# Patient Record
Sex: Female | Born: 1965
Health system: Southern US, Community
[De-identification: ages and names within clinical notes are randomized; demographics above are authoritative.]

## PROBLEM LIST (undated history)

## (undated) DIAGNOSIS — K219 Gastro-esophageal reflux disease without esophagitis: Secondary | ICD-10-CM

## (undated) DIAGNOSIS — T7840XA Allergy, unspecified, initial encounter: Secondary | ICD-10-CM

## (undated) HISTORY — DX: Allergy, unspecified, initial encounter: T78.40XA

## (undated) HISTORY — PX: APPENDECTOMY: SHX54

## (undated) HISTORY — DX: Gastro-esophageal reflux disease without esophagitis: K21.9

## (undated) HISTORY — PX: DENTAL RESTORATION/EXTRACTION WITH X-RAY: SHX5796

## (undated) HISTORY — PX: TUBAL LIGATION: SHX77

## (undated) HISTORY — PX: CARPAL TUNNEL RELEASE: SHX101

---

## 1997-12-30 ENCOUNTER — Inpatient Hospital Stay (HOSPITAL_COMMUNITY): Admission: AD | Admit: 1997-12-30 | Discharge: 1997-12-30 | Payer: Self-pay | Admitting: Obstetrics & Gynecology

## 1998-01-05 ENCOUNTER — Encounter (HOSPITAL_COMMUNITY): Admission: RE | Admit: 1998-01-05 | Discharge: 1998-02-13 | Payer: Self-pay | Admitting: Obstetrics & Gynecology

## 1998-01-16 ENCOUNTER — Inpatient Hospital Stay (HOSPITAL_COMMUNITY): Admission: AD | Admit: 1998-01-16 | Discharge: 1998-01-16 | Payer: Self-pay | Admitting: Obstetrics & Gynecology

## 1998-01-17 ENCOUNTER — Ambulatory Visit (HOSPITAL_COMMUNITY): Admission: RE | Admit: 1998-01-17 | Discharge: 1998-01-17 | Payer: Self-pay | Admitting: *Deleted

## 1998-02-08 ENCOUNTER — Encounter: Payer: Self-pay | Admitting: Obstetrics & Gynecology

## 1998-02-08 ENCOUNTER — Inpatient Hospital Stay (HOSPITAL_COMMUNITY): Admission: AD | Admit: 1998-02-08 | Discharge: 1998-02-11 | Payer: Self-pay | Admitting: *Deleted

## 1998-05-05 ENCOUNTER — Encounter: Payer: Self-pay | Admitting: Family Medicine

## 1998-05-05 ENCOUNTER — Ambulatory Visit (HOSPITAL_COMMUNITY): Admission: RE | Admit: 1998-05-05 | Discharge: 1998-05-05 | Payer: Self-pay | Admitting: Family Medicine

## 1998-10-06 ENCOUNTER — Emergency Department (HOSPITAL_COMMUNITY): Admission: EM | Admit: 1998-10-06 | Discharge: 1998-10-06 | Payer: Self-pay | Admitting: Emergency Medicine

## 1999-03-19 HISTORY — PX: TUBAL LIGATION: SHX77

## 1999-12-06 ENCOUNTER — Other Ambulatory Visit: Admission: RE | Admit: 1999-12-06 | Discharge: 1999-12-06 | Payer: Self-pay | Admitting: Obstetrics and Gynecology

## 2000-02-10 ENCOUNTER — Inpatient Hospital Stay (HOSPITAL_COMMUNITY): Admission: AD | Admit: 2000-02-10 | Discharge: 2000-02-10 | Payer: Self-pay | Admitting: Obstetrics and Gynecology

## 2000-02-11 ENCOUNTER — Encounter (INDEPENDENT_AMBULATORY_CARE_PROVIDER_SITE_OTHER): Payer: Self-pay

## 2000-02-11 ENCOUNTER — Inpatient Hospital Stay (HOSPITAL_COMMUNITY): Admission: AD | Admit: 2000-02-11 | Discharge: 2000-02-13 | Payer: Self-pay | Admitting: Obstetrics & Gynecology

## 2001-03-30 ENCOUNTER — Encounter: Admission: RE | Admit: 2001-03-30 | Discharge: 2001-05-12 | Payer: Self-pay | Admitting: Family Medicine

## 2001-05-12 ENCOUNTER — Encounter: Admission: RE | Admit: 2001-05-12 | Discharge: 2001-05-12 | Payer: Self-pay

## 2003-12-03 ENCOUNTER — Emergency Department (HOSPITAL_COMMUNITY): Admission: EM | Admit: 2003-12-03 | Discharge: 2003-12-03 | Payer: Self-pay | Admitting: Emergency Medicine

## 2004-05-29 ENCOUNTER — Other Ambulatory Visit: Admission: RE | Admit: 2004-05-29 | Discharge: 2004-05-29 | Payer: Self-pay | Admitting: Obstetrics and Gynecology

## 2004-11-14 ENCOUNTER — Emergency Department (HOSPITAL_COMMUNITY): Admission: EM | Admit: 2004-11-14 | Discharge: 2004-11-14 | Payer: Self-pay | Admitting: Emergency Medicine

## 2005-01-30 ENCOUNTER — Emergency Department (HOSPITAL_COMMUNITY): Admission: EM | Admit: 2005-01-30 | Discharge: 2005-01-30 | Payer: Self-pay | Admitting: Emergency Medicine

## 2005-07-17 ENCOUNTER — Encounter: Payer: Self-pay | Admitting: Obstetrics & Gynecology

## 2006-12-01 ENCOUNTER — Encounter: Admission: RE | Admit: 2006-12-01 | Discharge: 2006-12-01 | Payer: Self-pay | Admitting: Occupational Medicine

## 2009-12-19 ENCOUNTER — Observation Stay (HOSPITAL_COMMUNITY): Admission: EM | Admit: 2009-12-19 | Discharge: 2009-12-20 | Payer: Self-pay | Admitting: Emergency Medicine

## 2009-12-19 ENCOUNTER — Encounter (INDEPENDENT_AMBULATORY_CARE_PROVIDER_SITE_OTHER): Payer: Self-pay | Admitting: Internal Medicine

## 2010-04-08 ENCOUNTER — Encounter: Payer: Self-pay | Admitting: Internal Medicine

## 2010-05-31 LAB — CK TOTAL AND CKMB (NOT AT ARMC)
CK, MB: 1.4 ng/mL (ref 0.3–4.0)
CK, MB: 1.8 ng/mL (ref 0.3–4.0)
CK, MB: 2 ng/mL (ref 0.3–4.0)
Relative Index: INVALID (ref 0.0–2.5)
Relative Index: INVALID (ref 0.0–2.5)
Relative Index: INVALID (ref 0.0–2.5)
Total CK: 58 U/L (ref 7–177)
Total CK: 71 U/L (ref 7–177)
Total CK: 72 U/L (ref 7–177)

## 2010-05-31 LAB — HEMOGLOBIN A1C
Hgb A1c MFr Bld: 6.1 % — ABNORMAL HIGH (ref <5.7)
Mean Plasma Glucose: 128 mg/dL — ABNORMAL HIGH (ref <117)

## 2010-05-31 LAB — DIFFERENTIAL
Basophils Absolute: 0 10*3/uL (ref 0.0–0.1)
Basophils Relative: 0 % (ref 0–1)
Eosinophils Absolute: 0 10*3/uL (ref 0.0–0.7)
Eosinophils Relative: 1 % (ref 0–5)
Lymphocytes Relative: 19 % (ref 12–46)
Lymphs Abs: 1.1 10*3/uL (ref 0.7–4.0)
Monocytes Absolute: 0.2 10*3/uL (ref 0.1–1.0)
Monocytes Relative: 4 % (ref 3–12)
Neutro Abs: 4.4 10*3/uL (ref 1.7–7.7)
Neutrophils Relative %: 76 % (ref 43–77)

## 2010-05-31 LAB — URINALYSIS, ROUTINE W REFLEX MICROSCOPIC
Bilirubin Urine: NEGATIVE
Glucose, UA: NEGATIVE mg/dL
Hgb urine dipstick: NEGATIVE
Ketones, ur: NEGATIVE mg/dL
Nitrite: NEGATIVE
Protein, ur: NEGATIVE mg/dL
Specific Gravity, Urine: 1.025 (ref 1.005–1.030)
Urobilinogen, UA: 1 mg/dL (ref 0.0–1.0)
pH: 6.5 (ref 5.0–8.0)

## 2010-05-31 LAB — URINE MICROSCOPIC-ADD ON

## 2010-05-31 LAB — RAPID URINE DRUG SCREEN, HOSP PERFORMED
Amphetamines: NOT DETECTED
Barbiturates: NOT DETECTED
Benzodiazepines: NOT DETECTED
Cocaine: NOT DETECTED
Opiates: NOT DETECTED
Tetrahydrocannabinol: NOT DETECTED

## 2010-05-31 LAB — CBC
HCT: 42 % (ref 36.0–46.0)
Hemoglobin: 14.3 g/dL (ref 12.0–15.0)
MCH: 29.1 pg (ref 26.0–34.0)
MCHC: 33.9 g/dL (ref 30.0–36.0)
MCV: 85.8 fL (ref 78.0–100.0)
Platelets: 272 10*3/uL (ref 150–400)
RBC: 4.9 MIL/uL (ref 3.87–5.11)
RDW: 13.6 % (ref 11.5–15.5)
WBC: 5.8 10*3/uL (ref 4.0–10.5)

## 2010-05-31 LAB — TSH: TSH: 0.764 u[IU]/mL (ref 0.350–4.500)

## 2010-05-31 LAB — LIPID PANEL
Cholesterol: 144 mg/dL (ref 0–200)
HDL: 46 mg/dL (ref >39)
LDL Cholesterol: 82 mg/dL (ref 0–99)
Total CHOL/HDL Ratio: 3.1 RATIO
Triglycerides: 82 mg/dL (ref <150)
VLDL: 16 mg/dL (ref 0–40)

## 2010-05-31 LAB — COMPREHENSIVE METABOLIC PANEL
ALT: 17 U/L (ref 0–35)
AST: 23 U/L (ref 0–37)
Albumin: 3.8 g/dL (ref 3.5–5.2)
Alkaline Phosphatase: 101 U/L (ref 39–117)
BUN: 8 mg/dL (ref 6–23)
CO2: 32 mEq/L (ref 19–32)
Calcium: 9.5 mg/dL (ref 8.4–10.5)
Chloride: 102 mEq/L (ref 96–112)
Creatinine, Ser: 0.6 mg/dL (ref 0.4–1.2)
GFR calc Af Amer: >60 mL/min (ref >60)
GFR calc non Af Amer: >60 mL/min (ref >60)
Glucose, Bld: 126 mg/dL — ABNORMAL HIGH (ref 70–99)
Potassium: 4 mEq/L (ref 3.5–5.1)
Sodium: 140 mEq/L (ref 135–145)
Total Bilirubin: 1.2 mg/dL (ref 0.3–1.2)
Total Protein: 8 g/dL (ref 6.0–8.3)

## 2010-05-31 LAB — TROPONIN I
Troponin I: 0.01 ng/mL (ref 0.00–0.06)
Troponin I: 0.02 ng/mL (ref 0.00–0.06)
Troponin I: <0.01 ng/mL (ref 0.00–0.06)

## 2010-05-31 LAB — GLUCOSE, CAPILLARY: Glucose-Capillary: 121 mg/dL — ABNORMAL HIGH (ref 70–99)

## 2010-05-31 LAB — LIPASE, BLOOD: Lipase: 35 U/L (ref 11–59)

## 2010-05-31 LAB — POCT PREGNANCY, URINE: Preg Test, Ur: NEGATIVE

## 2010-08-03 NOTE — Op Note (Signed)
[facility]  Patient:    Anna Brooks, Anna Brooks                            MRN: [id number] Proc. Date: 02/12/00 Adm. Date:  [id number] Disc. Date: [id number] Attending:  Pleas Koch                           Operative Report  PREOPERATIVE DIAGNOSIS:       Postpartum elective sterilization.  POSTOPERATIVE DIAGNOSIS:      Postpartum elective sterilization.  OPERATION:                    Postpartum tubal ligation by Pomeroy method.  SURGEON:                      Georgina Peer, M.D.  ANESTHESIA:                   General, Dr. Arby Barrette.  ESTIMATED BLOOD LOSS:         Less than 25 cc.  COMPLICATIONS:                None.  INDICATIONS:                  This is a 45 year old gravida 6, para 5 who presented at term.  Had a spontaneous vaginal delivery by the nurse midwife service.  Desired no further children.  She consented to sterilization procedure for postpartum tubal.  Aware of risks and complications of procedure including bleeding, infection, 1-2% lifetime failure risk, and anesthetic complications.  She was willing to proceed.  DESCRIPTION OF PROCEDURE:     Patient was taken to the operating room, given general anesthetic, placed in a dorsal supine position.  Umbilical area was sterilely prepped and draped.  Marcaine 0.25% 7 cc was injected subumbilically and then an incision was made approximately 5 cm under the umbilicus.  The fascia was identified and divided.  Bleeders were cauterized.  Peritoneum identified and divided.  There were no adhesions.  The right tube was identified, traced to its fimbriated end.  Tube and ovary appeared normal.  A mid portion knuckle elevated.  Two plain gut sutures were placed across the knuckle of tube and the mid portion excised.  Bleeders were cauterized.  The left cornu was identified.  Tube traced to its fimbriated end.  Tube and ovary appeared normal.  Knuckle of tube was elevated.  Two sutures of plain ties  placed across this knuckle of tube and the mid portion excised.  The bleeders were cauterized.  There was excellent hemostasis.  The fascia was closed with Vicryl suture.  The skin closed with subcuticular Dexon.  Sponge, needle, and instrument counts were correct.  The patient tolerated the procedure well and returned to recovery area in stable condition. DD:  02/12/00 TD:  02/12/00 Job: [id number]

## 2010-08-03 NOTE — Discharge Summary (Signed)
[facility]  Patient:    Anna Brooks, Anna Brooks                            MRN: [id number] Adm. Date:  [id number] Disc. Date: [id number] Attending:  Pleas Brooks Dictator:   Anna Brooks, C.N.M.                           Discharge Summary  ADMISSION DIAGNOSES:          1. Intrauterine pregnancy at term.                               2. Grand multiparity.                               3. Negative group B strep.                               4. Active labor.                               5. Desires bilateral tubal ligation for                                  sterilization.  DISCHARGE DIAGNOSES:          1. Intrauterine pregnancy at term.                               2. Grand multiparity.                               3. Negative group B strep.                               4. Active labor.                               5. Desires bilateral tubal ligation for                                  sterilization.                               6. Bottle feeding.  PROCEDURE:                    1. Normal spontaneous vaginal delivery of a                                  viable female infant who had Apgars of 8 and                                  9 and  weighed 5 pounds 15 ounces on February 11, 2000 attended by Anna Brooks, C.N.M.                               2. Postpartum bilateral tubal ligation for                                  sterilization on February 12, 2000 by Dr.                                  Trudie Brooks.  HOSPITAL COURSE:              [name] is a 45 year old married African female gravida 6, para 5-0-0-5 at 27 1/7 weeks admitted in active labor who progressed to a normal spontaneous vaginal delivery of a viable female infant who had Apgars of 8 and 9 and weighed 5 pounds 15 ounces with no complications.  Her delivery was attended by Anna Brooks, C.N.M. and please see delivery note for details.  Her  postpartum course she had a bilateral tubal ligation on day #1 postpartum with no complications by Dr. Trudie Brooks.  She is ambulating, voiding, and eating without difficulty.  Her vital signs are stable and she is afebrile.  She is deemed ready for discharge today.  DISCHARGE INSTRUCTIONS:       As per the [facility] handout.  DISCHARGE MEDICATIONS:        1. Motrin 600 mg p.o. q.6h. p.r.n. for pain.                               2. Tylox one to two p.o. q.4-6h. p.r.n. for                                  pain.                               3. Prenatal vitamins daily.  DISCHARGE LABORATORIES:       Hemoglobin 10.2, platelets 207, WBC 10.7.  DISCHARGE FOLLOW-UP:          Six weeks at [facility] or p.r.n. DD:  02/13/00 TD:  02/13/00 Job: [id number]

## 2010-08-03 NOTE — H&P (Signed)
[facility]  Patient:    Anna Brooks, Anna Brooks                            MRN: [id number] Adm. Date:  [id number] Attending:  Cleatrice Burke Dictator:   Miguel Dibble, C.N.M.                         History and Physical  DATE OF BIRTH:                  11/06/1965  HISTORY OF PRESENT ILLNESS:     This is a 45 year old gravida 6, para 5 at 40-1/[redacted] weeks gestation who has been in early labor since 2:30 a.m.  Her cervix changed from 4 cm on her last exam in the office to 6 cm on presentation, 80% effaced, vertex at -2 and posterior.  She has a history of of negative group beta strep.  PRENATAL LABORATORY DATA:       Her prenatal labs on entry into the practice are as follows.  Hemoglobin 10.4, hematocrit 30.9, platelet count 256,000, blood type and Rh AB positive.  Rh antibody negative.  Sickle cell trait negative.  VDRL nonreactive.  Rubella titer immune.  Hepatitis B surface antigen negative.  Pap smear within normal limits.  Gonorrhea and Chlamydia cultures negative.  Glucose challenge test within normal limits.  Group beta strep at 36 weeks is negative.  Ultrasound reveals normal anatomy and agrees with dates at 31-6/7 weeks.  OBSTETRICAL HISTORY:            October 1991 normal spontaneous vaginal delivery of a viable female weighing 4 kg at 38 weeks after 8-9 hours of labor in [place].  In May 1994, normal spontaneous vaginal delivery of a viable baby girl weighing 3.5 kg at 39 weeks after 8 hours in [place].  October 1999, normal spontaneous vaginal delivery of a viable female weighing 4.1 kg at 39 weeks after 6 hours of labor in the [place].  June 1997, normal spontaneous vaginal delivery of a viable baby girl weighing 4.2 kg at 39 weeks after 7 hours labor in the [place].  November 1999 normal spontaneous vaginal delivery of a viable baby girl weighing 6 pounds at 40 weeks after 48 hours of labor born in [place] at [facility].  This is her sixth  pregnancy.  PRENATAL HISTORY:               This patient presented late to care at approximately 30 weeks and 4 days, attempted to receive care at [facility], however, had difficulty scheduling early appointment.  This pregnancy was complicated by an apparent dermatitis to her fingers that was treated with antibiotics by her family doctor.  She desired a postpartum tubal ligation.  ALLERGIES:                      SEPTRA.  PAST MEDICAL HISTORY:           History of varicose veins in the right leg, no current phlebitis.  Appendicitis and appendectomy at age 4.  FAMILY HISTORY:                 Negative.  GENETIC HISTORY:                The patient is 45 years old and previously declined amniocentesis or other genetic testing.  SOCIAL HISTORY:  The patient is [place] religion.  Married to [employer].  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  Father of the baby is a high Garment/textile technologist and also works at [employer].  Stable monogamous relationship. Denies smoking, alcohol or drug abuse.  PHYSICAL EXAMINATION:  HEENT:                          Within normal limits.  LUNGS:                          Bilaterally clear.  HEART:                          Regular rate and rhythm.  ABDOMEN:                        Soft and nontender.  Contractions every 7 minutes.  Fetal heart rate is reactive and reassuring.  EXTREMITIES:                    Right leg with nonphlebitic varicosities.  PELVIC:                         Cervix 6 cm, 80% effaced, vertex at -2, posterior.  ASSESSMENT:                     1. Grand multiparous at term in early active                                    phase labor.                                 2. Posterior position.                                 3. Negative group beta strep.                                  4. Desires postpartum tubal ligation.  PLAN:                           1. Admit to labor and delivery.                                 2. Notify Dr. Kathryne Sharper of admission                                    status.                                 3. Routine [facility] orders.  4. Anticipate normal spontaneous vaginal                                    delivery. DD:  02/11/00 TD:  02/11/00 Job: [id number]

## 2011-02-21 ENCOUNTER — Ambulatory Visit (INDEPENDENT_AMBULATORY_CARE_PROVIDER_SITE_OTHER): Payer: BC Managed Care – PPO

## 2011-02-21 DIAGNOSIS — J069 Acute upper respiratory infection, unspecified: Secondary | ICD-10-CM

## 2011-02-21 DIAGNOSIS — R300 Dysuria: Secondary | ICD-10-CM

## 2011-02-21 DIAGNOSIS — R509 Fever, unspecified: Secondary | ICD-10-CM

## 2011-03-08 ENCOUNTER — Ambulatory Visit (INDEPENDENT_AMBULATORY_CARE_PROVIDER_SITE_OTHER): Payer: BC Managed Care – PPO

## 2011-03-08 DIAGNOSIS — R05 Cough: Secondary | ICD-10-CM

## 2011-03-08 DIAGNOSIS — R059 Cough, unspecified: Secondary | ICD-10-CM

## 2011-03-08 DIAGNOSIS — R062 Wheezing: Secondary | ICD-10-CM

## 2011-03-11 ENCOUNTER — Ambulatory Visit (INDEPENDENT_AMBULATORY_CARE_PROVIDER_SITE_OTHER): Payer: BC Managed Care – PPO

## 2011-03-11 DIAGNOSIS — J159 Unspecified bacterial pneumonia: Secondary | ICD-10-CM

## 2011-03-11 DIAGNOSIS — R5381 Other malaise: Secondary | ICD-10-CM

## 2012-06-30 ENCOUNTER — Ambulatory Visit: Payer: BC Managed Care – PPO | Admitting: Family Medicine

## 2012-06-30 ENCOUNTER — Telehealth: Payer: Self-pay | Admitting: Family Medicine

## 2012-06-30 VITALS — BP 132/79 | HR 75 | Temp 98.0°F | Resp 16 | Ht 65.5 in | Wt 157.0 lb

## 2012-06-30 DIAGNOSIS — J019 Acute sinusitis, unspecified: Secondary | ICD-10-CM

## 2012-06-30 DIAGNOSIS — R109 Unspecified abdominal pain: Secondary | ICD-10-CM

## 2012-06-30 DIAGNOSIS — N390 Urinary tract infection, site not specified: Secondary | ICD-10-CM

## 2012-06-30 DIAGNOSIS — M545 Low back pain, unspecified: Secondary | ICD-10-CM

## 2012-06-30 LAB — POCT UA - MICROSCOPIC ONLY
Casts, Ur, LPF, POC: NEGATIVE
Crystals, Ur, HPF, POC: NEGATIVE
Yeast, UA: NEGATIVE

## 2012-06-30 LAB — POCT URINALYSIS DIPSTICK
Bilirubin, UA: NEGATIVE
Glucose, UA: NEGATIVE
Nitrite, UA: NEGATIVE
Protein, UA: 30
Spec Grav, UA: >=1.03
Urobilinogen, UA: 1
pH, UA: 5.5

## 2012-06-30 MED ORDER — CETIRIZINE HCL 10 MG PO TABS: 10.0000 mg | ORAL_TABLET | Freq: Every day | ORAL | Status: DC

## 2012-06-30 MED ORDER — FLUTICASONE PROPIONATE 50 MCG/ACT NA SUSP: 2.0000 | Freq: Every day | NASAL | Status: DC

## 2012-06-30 MED ORDER — METHYLPREDNISOLONE 4 MG PO KIT: PACK | ORAL | Status: DC

## 2012-06-30 MED ORDER — SULFAMETHOXAZOLE-TRIMETHOPRIM 800-160 MG PO TABS: 1.0000 | ORAL_TABLET | Freq: Two times a day (BID) | ORAL | Status: DC

## 2012-06-30 MED ORDER — MUCINEX DM MAXIMUM STRENGTH 60-1200 MG PO TB12: 1.0000 | ORAL_TABLET | Freq: Two times a day (BID) | ORAL | Status: DC

## 2012-06-30 NOTE — Patient Instructions (Addendum)
Sinusitis Sinusitis is redness, soreness, and swelling (inflammation) of the paranasal sinuses. Paranasal sinuses are air pockets within the bones of your face (beneath the eyes, the middle of the forehead, or above the eyes). In healthy paranasal sinuses, mucus is able to drain out, and air is able to circulate through them by way of your nose. However, when your paranasal sinuses are inflamed, mucus and air can become trapped. This can allow bacteria and other germs to grow and cause infection. Sinusitis can develop quickly and last only a short time (acute) or continue over a long period (chronic). Sinusitis that lasts for more than 12 weeks is considered chronic.  CAUSES  Causes of sinusitis include:  Allergies.  Structural abnormalities, such as displacement of the cartilage that separates your nostrils (deviated septum), which can decrease the air flow through your nose and sinuses and affect sinus drainage.  Functional abnormalities, such as when the small hairs (cilia) that line your sinuses and help remove mucus do not work properly or are not present. SYMPTOMS  Symptoms of acute and chronic sinusitis are the same. The primary symptoms are pain and pressure around the affected sinuses. Other symptoms include:  Upper toothache.  Earache.  Headache.  Bad breath.  Decreased sense of smell and taste.  A cough, which worsens when you are lying flat.  Fatigue.  Fever.  Thick drainage from your nose, which often is green and may contain pus (purulent).  Swelling and warmth over the affected sinuses. DIAGNOSIS  Your caregiver will perform a physical exam. During the exam, your caregiver may:  Look in your nose for signs of abnormal growths in your nostrils (nasal polyps).  Tap over the affected sinus to check for signs of infection.  View the inside of your sinuses (endoscopy) with a special imaging device with a light attached (endoscope), which is inserted into your  sinuses. If your caregiver suspects that you have chronic sinusitis, one or more of the following tests may be recommended:  Allergy tests.  Nasal culture A sample of mucus is taken from your nose and sent to a lab and screened for bacteria.  Nasal cytology A sample of mucus is taken from your nose and examined by your caregiver to determine if your sinusitis is related to an allergy. TREATMENT  Most cases of acute sinusitis are related to a viral infection and will resolve on their own within 10 days. Sometimes medicines are prescribed to help relieve symptoms (pain medicine, decongestants, nasal steroid sprays, or saline sprays).  However, for sinusitis related to a bacterial infection, your caregiver will prescribe antibiotic medicines. These are medicines that will help kill the bacteria causing the infection.  Rarely, sinusitis is caused by a fungal infection. In theses cases, your caregiver will prescribe antifungal medicine. For some cases of chronic sinusitis, surgery is needed. Generally, these are cases in which sinusitis recurs more than 3 times per year, despite other treatments. HOME CARE INSTRUCTIONS   Drink plenty of water. Water helps thin the mucus so your sinuses can drain more easily.  Use a humidifier.  Inhale steam 3 to 4 times a day (for example, sit in the bathroom with the shower running).  Apply a warm, moist washcloth to your face 3 to 4 times a day, or as directed by your caregiver.  Use saline nasal sprays to help moisten and clean your sinuses.  Take over-the-counter or prescription medicines for pain, discomfort, or fever only as directed by your caregiver. SEEK IMMEDIATE MEDICAL   CARE IF:  You have increasing pain or severe headaches.  You have nausea, vomiting, or drowsiness.  You have swelling around your face.  You have vision problems.  You have a stiff neck.  You have difficulty breathing. MAKE SURE YOU:   Understand these  instructions.  Will watch your condition.  Will get help right away if you are not doing well or get worse. Document Released: 03/04/2005 Document Revised: 05/27/2011 Document Reviewed: 03/19/2011 [facility] Patient Information 2013 [place], [place].   Low Back Strain with Rehab A strain is an injury in which a tendon or muscle is torn. The muscles and tendons of the lower back are vulnerable to strains. However, these muscles and tendons are very strong and require a great force to be injured. Strains are classified into three categories. Grade 1 strains cause pain, but the tendon is not lengthened. Grade 2 strains include a lengthened ligament, due to the ligament being stretched or partially ruptured. With grade 2 strains there is still function, although the function may be decreased. Grade 3 strains involve a complete tear of the tendon or muscle, and function is usually impaired. SYMPTOMS   Pain in the lower back.  Pain that affects one side more than the other.  Pain that gets worse with movement and may be felt in the hip, buttocks, or back of the thigh.  Muscle spasms of the muscles in the back.  Swelling along the muscles of the back.  Loss of strength of the back muscles.  Crackling sound (crepitation) when the muscles are touched. CAUSES  Lower back strains occur when a force is placed on the muscles or tendons that is greater than they can handle. Common causes of injury include:  Prolonged overuse of the muscle-tendon units in the lower back, usually from incorrect posture.  A single violent injury or force applied to the back. RISK INCREASES WITH:  Sports that involve twisting forces on the spine or a lot of bending at the waist (football, rugby, weightlifting, bowling, golf, tennis, speed skating, racquetball, swimming, running, gymnastics, diving).  Poor strength and flexibility.  Failure to warm up properly before activity.  Family history of lower back pain or  disk disorders.  Previous back injury or surgery (especially fusion).  Poor posture with lifting, especially heavy objects.  Prolonged sitting, especially with poor posture. PREVENTION   Learn and use proper posture when sitting or lifting (maintain proper posture when sitting, lift using the knees and legs, not at the waist).  Warm up and stretch properly before activity.  Allow for adequate recovery between workouts.  Maintain physical fitness:  Strength, flexibility, and endurance.  Cardiovascular fitness. PROGNOSIS  If treated properly, lower back strains usually heal within 6 weeks. RELATED COMPLICATIONS   Recurring symptoms, resulting in a chronic problem.  Chronic inflammation, scarring, and partial muscle-tendon tear.  Delayed healing or resolution of symptoms.  Prolonged disability. TREATMENT  Treatment first involves the use of ice and medicine, to reduce pain and inflammation. The use of strengthening and stretching exercises may help reduce pain with activity. These exercises may be performed at home or with a therapist. Severe injuries may require referral to a therapist for further evaluation and treatment, such as ultrasound. Your caregiver may advise that you wear a back brace or corset, to help reduce pain and discomfort. Often, prolonged bed rest results in greater harm then benefit. Corticosteroid injections may be recommended. However, these should be reserved for the most serious cases. It is important  to avoid using your back when lifting objects. At night, sleep on your back on a firm mattress with a pillow placed under your knees. If non-surgical treatment is unsuccessful, surgery may be needed.  MEDICATION   If pain medicine is needed, nonsteroidal anti-inflammatory medicines (aspirin and ibuprofen), or other minor pain relievers (acetaminophen), are often advised.  Do not take pain medicine for 7 days before surgery.  Prescription pain relievers may be  given, if your caregiver thinks they are needed. Use only as directed and only as much as you need.  Ointments applied to the skin may be helpful.  Corticosteroid injections may be given by your caregiver. These injections should be reserved for the most serious cases, because they may only be given a certain number of times. HEAT AND COLD  Cold treatment (icing) should be applied for 10 to 15 minutes every 2 to 3 hours for inflammation and pain, and immediately after activity that aggravates your symptoms. Use ice packs or an ice massage.  Heat treatment may be used before performing stretching and strengthening activities prescribed by your caregiver, physical therapist, or athletic trainer. Use a heat pack or a warm water soak. SEEK MEDICAL CARE IF:   Symptoms get worse or do not improve in 2 to 4 weeks, despite treatment.  You develop numbness, weakness, or loss of bowel or bladder function.  New, unexplained symptoms develop. (Drugs used in treatment may produce side effects.) EXERCISES  RANGE OF MOTION (ROM) AND STRETCHING EXERCISES - Low Back Strain Most people with lower back pain will find that their symptoms get worse with excessive bending forward (flexion) or arching at the lower back (extension). The exercises which will help resolve your symptoms will focus on the opposite motion.  Your physician, physical therapist or athletic trainer will help you determine which exercises will be most helpful to resolve your lower back pain. Do not complete any exercises without first consulting with your caregiver. Discontinue any exercises which make your symptoms worse until you speak to your caregiver.  If you have pain, numbness or tingling which travels down into your buttocks, leg or foot, the goal of the therapy is for these symptoms to move closer to your back and eventually resolve. Sometimes, these leg symptoms will get better, but your lower back pain may worsen. This is typically an  indication of progress in your rehabilitation. Be very alert to any changes in your symptoms and the activities in which you participated in the 24 hours prior to the change. Sharing this information with your caregiver will allow him/her to most efficiently treat your condition.  These exercises may help you when beginning to rehabilitate your injury. Your symptoms may resolve with or without further involvement from your physician, physical therapist or athletic trainer. While completing these exercises, remember:  Restoring tissue flexibility helps normal motion to return to the joints. This allows healthier, less painful movement and activity.  An effective stretch should be held for at least 30 seconds.  A stretch should never be painful. You should only feel a gentle lengthening or release in the stretched tissue. FLEXION RANGE OF MOTION AND STRETCHING EXERCISES: STRETCH  Flexion, Single Knee to Chest   Lie on a firm bed or floor with both legs extended in front of you.  Keeping one leg in contact with the floor, bring your opposite knee to your chest. Hold your leg in place by either grabbing behind your thigh or at your knee.  Pull until you  feel a gentle stretch in your lower back. Hold __________ seconds.  Slowly release your grasp and repeat the exercise with the opposite side. Repeat __________ times. Complete this exercise __________ times per day.  STRETCH  Flexion, Double Knee to Chest   Lie on a firm bed or floor with both legs extended in front of you.  Keeping one leg in contact with the floor, bring your opposite knee to your chest.  Tense your stomach muscles to support your back and then lift your other knee to your chest. Hold your legs in place by either grabbing behind your thighs or at your knees.  Pull both knees toward your chest until you feel a gentle stretch in your lower back. Hold __________ seconds.  Tense your stomach muscles and slowly return one leg  at a time to the floor. Repeat __________ times. Complete this exercise __________ times per day.  STRETCH  Low Trunk Rotation  Lie on a firm bed or floor. Keeping your legs in front of you, bend your knees so they are both pointed toward the ceiling and your feet are flat on the floor.  Extend your arms out to the side. This will stabilize your upper body by keeping your shoulders in contact with the floor.  Gently and slowly drop both knees together to one side until you feel a gentle stretch in your lower back. Hold for __________ seconds.  Tense your stomach muscles to support your lower back as you bring your knees back to the starting position. Repeat the exercise to the other side. Repeat __________ times. Complete this exercise __________ times per day  EXTENSION RANGE OF MOTION AND FLEXIBILITY EXERCISES: STRETCH  Extension, Prone on Elbows   Lie on your stomach on the floor, a bed will be too soft. Place your palms about shoulder width apart and at the height of your head.  Place your elbows under your shoulders. If this is too painful, stack pillows under your chest.  Allow your body to relax so that your hips drop lower and make contact more completely with the floor.  Hold this position for __________ seconds.  Slowly return to lying flat on the floor. Repeat __________ times. Complete this exercise __________ times per day.  RANGE OF MOTION  Extension, Prone Press Ups  Lie on your stomach on the floor, a bed will be too soft. Place your palms about shoulder width apart and at the height of your head.  Keeping your back as relaxed as possible, slowly straighten your elbows while keeping your hips on the floor. You may adjust the placement of your hands to maximize your comfort. As you gain motion, your hands will come more underneath your shoulders.  Hold this position __________ seconds.  Slowly return to lying flat on the floor. Repeat __________ times. Complete this  exercise __________ times per day.  RANGE OF MOTION- Quadruped, Neutral Spine   Assume a hands and knees position on a firm surface. Keep your hands under your shoulders and your knees under your hips. You may place padding under your knees for comfort.  Drop your head and point your tail bone toward the ground below you. This will round out your lower back like an angry cat. Hold this position for __________ seconds.  Slowly lift your head and release your tail bone so that your back sags into a large arch, like an old horse.  Hold this position for __________ seconds.  Repeat this until you feel limber  in your lower back.  Now, find your "sweet spot." This will be the most comfortable position somewhere between the two previous positions. This is your neutral spine. Once you have found this position, tense your stomach muscles to support your lower back.  Hold this position for __________ seconds. Repeat __________ times. Complete this exercise __________ times per day.  STRENGTHENING EXERCISES - Low Back Strain These exercises may help you when beginning to rehabilitate your injury. These exercises should be done near your "sweet spot." This is the neutral, low-back arch, somewhere between fully rounded and fully arched, that is your least painful position. When performed in this safe range of motion, these exercises can be used for people who have either a flexion or extension based injury. These exercises may resolve your symptoms with or without further involvement from your physician, physical therapist or athletic trainer. While completing these exercises, remember:   Muscles can gain both the endurance and the strength needed for everyday activities through controlled exercises.  Complete these exercises as instructed by your physician, physical therapist or athletic trainer. Increase the resistance and repetitions only as guided.  You may experience muscle soreness or fatigue, but  the pain or discomfort you are trying to eliminate should never worsen during these exercises. If this pain does worsen, stop and make certain you are following the directions exactly. If the pain is still present after adjustments, discontinue the exercise until you can discuss the trouble with your caregiver. STRENGTHENING Deep Abdominals, Pelvic Tilt  Lie on a firm bed or floor. Keeping your legs in front of you, bend your knees so they are both pointed toward the ceiling and your feet are flat on the floor.  Tense your lower abdominal muscles to press your lower back into the floor. This motion will rotate your pelvis so that your tail bone is scooping upwards rather than pointing at your feet or into the floor.  With a gentle tension and even breathing, hold this position for __________ seconds. Repeat __________ times. Complete this exercise __________ times per day.  STRENGTHENING  Abdominals, Crunches   Lie on a firm bed or floor. Keeping your legs in front of you, bend your knees so they are both pointed toward the ceiling and your feet are flat on the floor. Cross your arms over your chest.  Slightly tip your chin down without bending your neck.  Tense your abdominals and slowly lift your trunk high enough to just clear your shoulder blades. Lifting higher can put excessive stress on the lower back and does not further strengthen your abdominal muscles.  Control your return to the starting position. Repeat __________ times. Complete this exercise __________ times per day.  STRENGTHENING  Quadruped, Opposite UE/LE Lift   Assume a hands and knees position on a firm surface. Keep your hands under your shoulders and your knees under your hips. You may place padding under your knees for comfort.  Find your neutral spine and gently tense your abdominal muscles so that you can maintain this position. Your shoulders and hips should form a rectangle that is parallel with the floor and is not  twisted.  Keeping your trunk steady, lift your right hand no higher than your shoulder and then your left leg no higher than your hip. Make sure you are not holding your breath. Hold this position __________ seconds.  Continuing to keep your abdominal muscles tense and your back steady, slowly return to your starting position. Repeat with the opposite  arm and leg. Repeat __________ times. Complete this exercise __________ times per day.  STRENGTHENING  Lower Abdominals, Double Knee Lift  Lie on a firm bed or floor. Keeping your legs in front of you, bend your knees so they are both pointed toward the ceiling and your feet are flat on the floor.  Tense your abdominal muscles to brace your lower back and slowly lift both of your knees until they come over your hips. Be certain not to hold your breath.  Hold __________ seconds. Using your abdominal muscles, return to the starting position in a slow and controlled manner. Repeat __________ times. Complete this exercise __________ times per day.  POSTURE AND BODY MECHANICS CONSIDERATIONS - Low Back Strain Keeping correct posture when sitting, standing or completing your activities will reduce the stress put on different body tissues, allowing injured tissues a chance to heal and limiting painful experiences. The following are general guidelines for improved posture. Your physician or physical therapist will provide you with any instructions specific to your needs. While reading these guidelines, remember:  The exercises prescribed by your provider will help you have the flexibility and strength to maintain correct postures.  The correct posture provides the best environment for your joints to work. All of your joints have less wear and tear when properly supported by a spine with good posture. This means you will experience a healthier, less painful body.  Correct posture must be practiced with all of your activities, especially prolonged sitting and  standing. Correct posture is as important when doing repetitive low-stress activities (typing) as it is when doing a single heavy-load activity (lifting). RESTING POSITIONS Consider which positions are most painful for you when choosing a resting position. If you have pain with flexion-based activities (sitting, bending, stooping, squatting), choose a position that allows you to rest in a less flexed posture. You would want to avoid curling into a fetal position on your side. If your pain worsens with extension-based activities (prolonged standing, working overhead), avoid resting in an extended position such as sleeping on your stomach. Most people will find more comfort when they rest with their spine in a more neutral position, neither too rounded nor too arched. Lying on a non-sagging bed on your side with a pillow between your knees, or on your back with a pillow under your knees will often provide some relief. Keep in mind, being in any one position for a prolonged period of time, no matter how correct your posture, can still lead to stiffness. PROPER SITTING POSTURE In order to minimize stress and discomfort on your spine, you must sit with correct posture. Sitting with good posture should be effortless for a healthy body. Returning to good posture is a gradual process. Many people can work toward this most comfortably by using various supports until they have the flexibility and strength to maintain this posture on their own. When sitting with proper posture, your ears will fall over your shoulders and your shoulders will fall over your hips. You should use the back of the chair to support your upper back. Your lower back will be in a neutral position, just slightly arched. You may place a small pillow or folded towel at the base of your lower back for support.  When working at a desk, create an environment that supports good, upright posture. Without extra support, muscles tire, which leads to  excessive strain on joints and other tissues. Keep these recommendations in mind: CHAIR:  A chair should be  able to slide under your desk when your back makes contact with the back of the chair. This allows you to work closely.  The chair's height should allow your eyes to be level with the upper part of your monitor and your hands to be slightly lower than your elbows. BODY POSITION  Your feet should make contact with the floor. If this is not possible, use a foot rest.  Keep your ears over your shoulders. This will reduce stress on your neck and lower back. INCORRECT SITTING POSTURES  If you are feeling tired and unable to assume a healthy sitting posture, do not slouch or slump. This puts excessive strain on your back tissues, causing more damage and pain. Healthier options include:  Using more support, like a lumbar pillow.  Switching tasks to something that requires you to be upright or walking.  Talking a brief walk.  Lying down to rest in a neutral-spine position. PROLONGED STANDING WHILE SLIGHTLY LEANING FORWARD  When completing a task that requires you to lean forward while standing in one place for a long time, place either foot up on a stationary 2-4 inch high object to help maintain the best posture. When both feet are on the ground, the lower back tends to lose its slight inward curve. If this curve flattens (or becomes too large), then the back and your other joints will experience too much stress, tire more quickly, and can cause pain. CORRECT STANDING POSTURES Proper standing posture should be assumed with all daily activities, even if they only take a few moments, like when brushing your teeth. As in sitting, your ears should fall over your shoulders and your shoulders should fall over your hips. You should keep a slight tension in your abdominal muscles to brace your spine. Your tailbone should point down to the ground, not behind your body, resulting in an over-extended  swayback posture.  INCORRECT STANDING POSTURES  Common incorrect standing postures include a forward head, locked knees and/or an excessive swayback. WALKING Walk with an upright posture. Your ears, shoulders and hips should all line-up. PROLONGED ACTIVITY IN A FLEXED POSITION When completing a task that requires you to bend forward at your waist or lean over a low surface, try to find a way to stabilize 3 out of 4 of your limbs. You can place a hand or elbow on your thigh or rest a knee on the surface you are reaching across. This will provide you more stability so that your muscles do not fatigue as quickly. By keeping your knees relaxed, or slightly bent, you will also reduce stress across your lower back. CORRECT LIFTING TECHNIQUES DO :   Assume a wide stance. This will provide you more stability and the opportunity to get as close as possible to the object which you are lifting.  Tense your abdominals to brace your spine. Bend at the knees and hips. Keeping your back locked in a neutral-spine position, lift using your leg muscles. Lift with your legs, keeping your back straight.  Test the weight of unknown objects before attempting to lift them.  Try to keep your elbows locked down at your sides in order get the best strength from your shoulders when carrying an object.  Always ask for help when lifting heavy or awkward objects. INCORRECT LIFTING TECHNIQUES DO NOT:   Lock your knees when lifting, even if it is a small object.  Bend and twist. Pivot at your feet or move your feet when needing to change  directions.  Assume that you can safely pick up even a paper clip without proper posture. Document Released: 03/04/2005 Document Revised: 05/27/2011 Document Reviewed: 06/16/2008 [facility] Patient Information 2013 [place], [place].

## 2012-06-30 NOTE — Progress Notes (Signed)
Subjective:    Patient ID: Anna Brooks, female    DOB: 07/21/1965, 47 y.o.   MRN: [id number] Chief Complaint  Patient presents with  . Sinusitis    x 5 day  . Rash    today  . Cough    x 5 day  . Abdominal Pain    x 3 day  . Back Pain    x 3 day    HPI  For the past 5d she has had headache treated with mucinex, dayquil, and ibuprofen w/o relief.  It is getting worse - she woke up with PND and feels weak.  She is sneezing a lot, diffuse sinus pressure, clear mucous, did have a very low grade fever and chills last night to 99.9.  Is having some left teeth upper due to cavity - has dental appt next wk.  Has submandibular LAD and eyes itchy but no sore throat or ear pain.  Is having itchy eyes and itchy nose.  No allergy medicines but was using zyrtec prev.  Also she feels like she is developing a rash on her arms this morning.  Also having bilateral upper abd pain and lower back pain worse with bending over.  Has been taking ibuprofen 400mg  bid approx though none today.  No changes in urine, Normal bowels. Normal appetite, no n/v until this morning when she lost her taste.  Occ heartburn/indigestion for which she takes prn tums.  History reviewed. No pertinent past medical history. No current outpatient prescriptions on file prior to visit.   No current facility-administered medications on file prior to visit.   Allergies  Allergen Reactions  . Sulfa Antibiotics      Review of Systems  Constitutional: Positive for fever, chills and fatigue. Negative for diaphoresis, activity change and appetite change.  HENT: Positive for congestion, rhinorrhea, sneezing, dental problem, postnasal drip and sinus pressure. Negative for ear pain, nosebleeds, sore throat, trouble swallowing, neck pain, neck stiffness, voice change and ear discharge.   Eyes: Positive for itching. Negative for discharge.  Respiratory: Positive for cough. Negative for shortness of breath.   Cardiovascular: Negative for  chest pain.  Gastrointestinal: Positive for abdominal pain. Negative for nausea, vomiting, diarrhea and constipation.  Genitourinary: Negative for dysuria, frequency, decreased urine volume and difficulty urinating.  Musculoskeletal: Positive for back pain.  Skin: Positive for rash.  Neurological: Positive for weakness and headaches. Negative for dizziness and syncope.  Hematological: Positive for adenopathy.  Psychiatric/Behavioral: Positive for sleep disturbance.      BP 132/79  Pulse 75  Temp(Src) 98 F (36.7 C) (Oral)  Resp 16  Ht 5' 5.5" (1.664 m)  Wt 157 lb (71.215 kg)  BMI 25.72 kg/m2  SpO2 97% Objective:   Physical Exam  Constitutional: She is oriented to person, place, and time. She appears well-developed and well-nourished. She does not appear ill. No distress.  HENT:  Head: Normocephalic and atraumatic.  Right Ear: External ear and ear canal normal. Tympanic membrane is retracted. A middle ear effusion is present.  Left Ear: External ear and ear canal normal. Tympanic membrane is retracted. A middle ear effusion is present.  Nose: Mucosal edema and rhinorrhea present. Right sinus exhibits maxillary sinus tenderness. Left sinus exhibits maxillary sinus tenderness.  Mouth/Throat: Uvula is midline and mucous membranes are normal. No oropharyngeal exudate, posterior oropharyngeal edema, posterior oropharyngeal erythema or tonsillar abscesses.  Eyes: Conjunctivae are normal. Right eye exhibits no discharge. Left eye exhibits no discharge. No scleral icterus.  Neck: Normal range  of motion. Neck supple.  Cardiovascular: Normal rate, regular rhythm, normal heart sounds and intact distal pulses.   Pulmonary/Chest: Effort normal and breath sounds normal.  Musculoskeletal:       Lumbar back: She exhibits bony tenderness. She exhibits normal range of motion, no tenderness and no spasm.  Lymphadenopathy:       Head (right side): Submandibular adenopathy present. No preauricular and  no posterior auricular adenopathy present.       Head (left side): Submandibular adenopathy present. No preauricular and no posterior auricular adenopathy present.    She has no cervical adenopathy.       Right: No supraclavicular adenopathy present.       Left: No supraclavicular adenopathy present.  Neurological: She is alert and oriented to person, place, and time.  Skin: Skin is warm and dry. She is not diaphoretic. No erythema.  Psychiatric: She has a normal mood and affect. Her behavior is normal.           Results for orders placed in visit on 06/30/12  POCT URINALYSIS DIPSTICK      Result Value Range   Color, UA amber     Clarity, UA cloudy     Glucose, UA neg     Bilirubin, UA neg     Ketones, UA trace     Spec Grav, UA >=1.030     Blood, UA trace-intact     pH, UA 5.5     Protein, UA 30     Urobilinogen, UA 1.0     Nitrite, UA neg     Leukocytes, UA large (3+)    POCT UA - MICROSCOPIC ONLY      Result Value Range   WBC, Ur, HPF, POC tntc     RBC, urine, microscopic 3-11     Bacteria, U Microscopic 3+     Mucus, UA large     Epithelial cells, urine per micros 6-22     Crystals, Ur, HPF, POC neg     Casts, Ur, LPF, POC neg     Yeast, UA neg        Assessment & Plan:  Abdominal pain - Plan: POCT urinalysis dipstick, POCT UA - Microscopic Only  UTI (urinary tract infection) - Plan: Urine culture - unsure if UTI as urine concentrated and not adequate clean catch. Initially placed on bactrim but pharmacy called and pt has an allergy to bactrim so will transition to keflex 500 bid while culture is pending.  Sinusitis, acute - suspect due to congestion/viral but placed on keflex for UTI which would cover. Start medrol dose pack in addition to flonase, zyrtec and mucinex DM.  Lumbago - muscle spasm/strain - prednisone should help.  Meds ordered this encounter  Medications  . methylPREDNISolone (MEDROL, PAK,) 4 MG tablet    Sig: follow package directions     Dispense:  21 tablet    Refill:  0  . sulfamethoxazole-trimethoprim (BACTRIM DS,SEPTRA DS) 800-160 MG per tablet    Sig: Take 1 tablet by mouth 2 (two) times daily.    Dispense:  6 tablet    Refill:  0  . fluticasone (FLONASE) 50 MCG/ACT nasal spray    Sig: Place 2 sprays into the nose daily.    Dispense:  16 g    Refill:  6  . cetirizine (ZYRTEC) 10 MG tablet    Sig: Take 1 tablet (10 mg total) by mouth daily.    Dispense:  30 tablet    Refill:  3  . Dextromethorphan-Guaifenesin (MUCINEX DM MAXIMUM STRENGTH) 60-1200 MG TB12    Sig: Take 1 tablet by mouth every 12 (twelve) hours.    Dispense:  40 each    Refill:  0

## 2012-06-30 NOTE — Telephone Encounter (Signed)
Pharmacy called they have sulfa allergy listed for patient and wants to know if can change septra. Spoke with Dr. Clelia Croft. Change to Keflex 500mg  1 po bid #14. Pharmacy notified.

## 2012-07-01 LAB — URINE CULTURE
Colony Count: NO GROWTH
Organism ID, Bacteria: NO GROWTH

## 2012-07-03 ENCOUNTER — Encounter: Payer: Self-pay | Admitting: Family Medicine

## 2012-07-09 ENCOUNTER — Telehealth: Payer: Self-pay

## 2012-07-09 NOTE — Telephone Encounter (Signed)
Spoke with patient about labs.  Patient states understanding.   

## 2012-10-21 ENCOUNTER — Ambulatory Visit (INDEPENDENT_AMBULATORY_CARE_PROVIDER_SITE_OTHER): Payer: BC Managed Care – PPO | Admitting: Physician Assistant

## 2012-10-21 ENCOUNTER — Encounter: Payer: Self-pay | Admitting: Physician Assistant

## 2012-10-21 VITALS — BP 126/76 | HR 88 | Temp 99.2°F | Resp 16 | Ht 65.5 in | Wt 161.0 lb

## 2012-10-21 DIAGNOSIS — R12 Heartburn: Secondary | ICD-10-CM

## 2012-10-21 DIAGNOSIS — Z1239 Encounter for other screening for malignant neoplasm of breast: Secondary | ICD-10-CM

## 2012-10-21 DIAGNOSIS — Z0184 Encounter for antibody response examination: Secondary | ICD-10-CM

## 2012-10-21 DIAGNOSIS — L299 Pruritus, unspecified: Secondary | ICD-10-CM

## 2012-10-21 DIAGNOSIS — Z0000 Encounter for general adult medical examination without abnormal findings: Secondary | ICD-10-CM

## 2012-10-21 LAB — POCT URINALYSIS DIPSTICK
Bilirubin, UA: NEGATIVE
Glucose, UA: NEGATIVE
Ketones, UA: NEGATIVE
Nitrite, UA: NEGATIVE
Protein, UA: NEGATIVE
Spec Grav, UA: 1.025
Urobilinogen, UA: 1
pH, UA: 5.5

## 2012-10-21 LAB — LIPID PANEL
Cholesterol: 151 mg/dL (ref 0–200)
HDL: 53 mg/dL (ref >39)
LDL Cholesterol: 78 mg/dL (ref 0–99)
Total CHOL/HDL Ratio: 2.8 Ratio
Triglycerides: 99 mg/dL (ref <150)
VLDL: 20 mg/dL (ref 0–40)

## 2012-10-21 LAB — COMPREHENSIVE METABOLIC PANEL
ALT: 15 U/L (ref 0–35)
AST: 23 U/L (ref 0–37)
Albumin: 3.9 g/dL (ref 3.5–5.2)
Alkaline Phosphatase: 82 U/L (ref 39–117)
BUN: 11 mg/dL (ref 6–23)
CO2: 28 mEq/L (ref 19–32)
Calcium: 10 mg/dL (ref 8.4–10.5)
Chloride: 103 mEq/L (ref 96–112)
Creat: 0.62 mg/dL (ref 0.50–1.10)
Glucose, Bld: 108 mg/dL — ABNORMAL HIGH (ref 70–99)
Potassium: 3.8 mEq/L (ref 3.5–5.3)
Sodium: 139 mEq/L (ref 135–145)
Total Bilirubin: 0.9 mg/dL (ref 0.3–1.2)
Total Protein: 7.2 g/dL (ref 6.0–8.3)

## 2012-10-21 LAB — CBC WITH DIFFERENTIAL/PLATELET
Basophils Absolute: 0 10*3/uL (ref 0.0–0.1)
Basophils Relative: 0 % (ref 0–1)
Eosinophils Absolute: 0.1 10*3/uL (ref 0.0–0.7)
Eosinophils Relative: 2 % (ref 0–5)
HCT: 40.2 % (ref 36.0–46.0)
Hemoglobin: 13.9 g/dL (ref 12.0–15.0)
Lymphocytes Relative: 38 % (ref 12–46)
Lymphs Abs: 1.8 10*3/uL (ref 0.7–4.0)
MCH: 28.3 pg (ref 26.0–34.0)
MCHC: 34.6 g/dL (ref 30.0–36.0)
MCV: 81.7 fL (ref 78.0–100.0)
Monocytes Absolute: 0.4 10*3/uL (ref 0.1–1.0)
Monocytes Relative: 9 % (ref 3–12)
Neutro Abs: 2.5 10*3/uL (ref 1.7–7.7)
Neutrophils Relative %: 51 % (ref 43–77)
Platelets: 258 10*3/uL (ref 150–400)
RBC: 4.92 MIL/uL (ref 3.87–5.11)
RDW: 14.3 % (ref 11.5–15.5)
WBC: 4.9 10*3/uL (ref 4.0–10.5)

## 2012-10-21 LAB — TSH: TSH: 0.438 u[IU]/mL (ref 0.350–4.500)

## 2012-10-21 MED ORDER — RANITIDINE HCL 300 MG PO TABS: 300.0000 mg | ORAL_TABLET | Freq: Every day | ORAL | Status: DC

## 2012-10-21 NOTE — Progress Notes (Signed)
[address] [place] [address]   Phone [phone]  Subjective:    Patient ID: Anna Brooks, female    DOB: [birth year], 47 y.o.   MRN: [id number]  HPI Pt presents to clinic for CPE.  She is doing well.  She has been without insurance for a while and has not gotten a lot of her preventative care performed but she would like to get back on track.  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  She is having heartburn when she eats spicy foods.  She would also like a referral to an allergist because she is noticing that when she touches certain foods she gets very itchy - she has so far noticed cheese and chocolate but she is worried that there are other foods and she does not want her reaction to get worse if she continues to handle them and she is really allergic to these foods.  Last dentist visit in 5/14.  Review of Systems  Constitutional: Negative.   HENT: Negative.   Eyes: Negative.   Respiratory: Negative.   Cardiovascular: Negative.   Gastrointestinal:       Heartburn  Endocrine: Negative.   Genitourinary: Negative.   Musculoskeletal: Negative.   Skin:       Itching with certain food handling  Allergic/Immunologic: Negative.   Neurological: Negative.   Hematological: Negative.   Psychiatric/Behavioral: Negative.        Objective:   Physical Exam  Vitals reviewed. Constitutional: She is oriented to person, place, and time. She appears well-developed and well-nourished.  HENT:  Head: Normocephalic and atraumatic.  Right Ear: Hearing, tympanic membrane, external ear and ear canal normal.  Left Ear: Hearing, tympanic membrane, external ear and ear canal normal.  Nose: Nose normal.  Mouth/Throat: Uvula is midline, oropharynx is clear and moist and mucous membranes are normal.  Eyes: Conjunctivae and EOM are normal. Pupils are equal, round, and reactive to light.  Neck: Normal range of motion. Neck supple. No thyromegaly present.  Cardiovascular:  Normal rate, regular rhythm and intact distal pulses.   No murmur heard. Pulmonary/Chest: Effort normal and breath sounds normal.  Abdominal: Soft. Bowel sounds are normal.  Genitourinary: Vagina normal and uterus normal. No breast swelling, tenderness, discharge or bleeding. Pelvic exam was performed with patient supine. No labial fusion. There is no rash, tenderness, lesion or injury on the right labia. There is no rash, tenderness, lesion or injury on the left labia. Cervix exhibits no motion tenderness, no discharge and no friability. Right adnexum displays no mass, no tenderness and no fullness. Left adnexum displays no mass and no fullness.  Musculoskeletal: Normal range of motion.  Lymphadenopathy:    She has no cervical adenopathy.  Neurological: She is alert and oriented to person, place, and time. She has normal reflexes.  Skin: Skin is warm and dry.  Psychiatric: She has a normal mood and affect. Her behavior is normal. Judgment and thought content normal.       Assessment & Plan:  PE (physical exam), annual - Plan: CBC with Differential, Lipid panel, TSH, Comprehensive metabolic panel, POCT urinalysis dipstick, Pap IG and HPV (high risk) DNA detection  Immunity status testing - Plan: Hepatitis B surface antibody, Measles/Mumps/Rubella Immunity, Varicella zoster antibody, IgG - when the results return I will get a copy to the patient for her work.  Screening for breast cancer - Plan: MM Digital Screening  Itching - Plan: Ambulatory referral to Allergy  Heartburn -  Plan: ranitidine (ZANTAC) 300 MG tablet  Pt to schedule an eye exam.  Benny Lennert PA-C 10/21/2012 4:58 PM

## 2012-10-22 ENCOUNTER — Encounter: Payer: Self-pay | Admitting: Physician Assistant

## 2012-10-22 ENCOUNTER — Encounter: Payer: Self-pay | Admitting: Radiology

## 2012-10-22 LAB — VARICELLA ZOSTER ANTIBODY, IGG: Varicella IgG: 961.5 Index — ABNORMAL HIGH (ref <135.00)

## 2012-10-22 LAB — MEASLES/MUMPS/RUBELLA IMMUNITY
Mumps IgG: >300 AU/mL — ABNORMAL HIGH (ref <9.00)
Rubella: 26 Index — ABNORMAL HIGH (ref <0.90)
Rubeola IgG: 176 AU/mL — ABNORMAL HIGH (ref <25.00)

## 2012-10-22 LAB — PAP IG AND HPV HIGH-RISK: HPV DNA High Risk: NOT DETECTED

## 2012-10-22 LAB — HEPATITIS B SURFACE ANTIBODY, QUANTITATIVE: Hepatitis B-Post: 2.5 m[IU]/mL

## 2012-10-22 NOTE — Addendum Note (Signed)
Addended by: Morrell Riddle on: 10/22/2012 09:52 AM   Modules accepted: Orders

## 2012-11-05 ENCOUNTER — Ambulatory Visit (INDEPENDENT_AMBULATORY_CARE_PROVIDER_SITE_OTHER): Payer: BC Managed Care – PPO | Admitting: Physician Assistant

## 2012-11-05 ENCOUNTER — Ambulatory Visit: Payer: BC Managed Care – PPO

## 2012-11-05 DIAGNOSIS — Z23 Encounter for immunization: Secondary | ICD-10-CM

## 2012-11-05 DIAGNOSIS — Z0184 Encounter for antibody response examination: Secondary | ICD-10-CM

## 2012-11-05 NOTE — Progress Notes (Signed)
  Subjective:    Patient ID: Anna Brooks, female    DOB: [birth year], 47 y.o.   MRN: [id number]  HPI    Review of Systems     Objective:   Physical Exam        Assessment & Plan:  Patient here for Hep B injection only.

## 2012-11-18 ENCOUNTER — Ambulatory Visit: Payer: Self-pay

## 2013-04-19 ENCOUNTER — Encounter: Payer: Self-pay | Admitting: Physician Assistant

## 2013-06-02 ENCOUNTER — Encounter: Payer: Self-pay | Admitting: Physician Assistant

## 2016-03-01 ENCOUNTER — Encounter (HOSPITAL_COMMUNITY): Payer: Self-pay | Admitting: Emergency Medicine

## 2016-03-01 ENCOUNTER — Emergency Department (HOSPITAL_COMMUNITY): Payer: BLUE CROSS/BLUE SHIELD

## 2016-03-01 ENCOUNTER — Emergency Department (HOSPITAL_COMMUNITY)
Admission: EM | Admit: 2016-03-01 | Discharge: 2016-03-01 | Disposition: A | Discharge status: Home / Self Care | Payer: BLUE CROSS/BLUE SHIELD | Attending: Emergency Medicine | Admitting: Emergency Medicine

## 2016-03-01 DIAGNOSIS — R05 Cough: Secondary | ICD-10-CM | POA: Diagnosis not present

## 2016-03-01 DIAGNOSIS — J189 Pneumonia, unspecified organism: Secondary | ICD-10-CM

## 2016-03-01 DIAGNOSIS — Z79899 Other long term (current) drug therapy: Secondary | ICD-10-CM | POA: Diagnosis not present

## 2016-03-01 DIAGNOSIS — J181 Lobar pneumonia, unspecified organism: Secondary | ICD-10-CM | POA: Insufficient documentation

## 2016-03-01 MED ORDER — ALBUTEROL SULFATE HFA 108 (90 BASE) MCG/ACT IN AERS
2.0000 | INHALATION_SPRAY | RESPIRATORY_TRACT | Status: DC | PRN
  Administered 2016-03-01: 2 via RESPIRATORY_TRACT
  Filled 2016-03-01: qty 6.7

## 2016-03-01 MED ORDER — LEVOFLOXACIN 750 MG PO TABS: 750.0000 mg | ORAL_TABLET | Freq: Every day | ORAL | 0 refills | Status: DC

## 2016-03-01 MED ORDER — BENZONATATE 100 MG PO CAPS: 100.0000 mg | ORAL_CAPSULE | Freq: Three times a day (TID) | ORAL | 0 refills | Status: DC

## 2016-03-01 MED ORDER — IBUPROFEN 800 MG PO TABS
800.0000 mg | ORAL_TABLET | Freq: Once | ORAL | Status: AC
  Administered 2016-03-01: 800 mg via ORAL
  Filled 2016-03-01: qty 1

## 2016-03-01 NOTE — ED Provider Notes (Signed)
[facility] DEPT Provider Note   CSN: [id number] Arrival date & time: 03/01/16  1521  By signing my name below, I, Soijett Blue, attest that this documentation has been prepared under the direction and in the presence of Domenic Moras, PA-C Electronically Signed: Soijett Blue, ED Scribe. 03/01/16. 3:55 PM.  History   Chief Complaint Chief Complaint  Patient presents with  . Flu symptoms    HPI Anna Brooks is a 50 y.o. female who presents to the Emergency Department complaining of dry cough onset yesterday. Pt notes that her daughter had similar symptoms last week. Pt denies getting her flu shot this year prior to the onset of her symptoms. She states that she is having associated symptoms of sneezing, fever of 102, nasal congestion, rhinorrhea, HA, body aches, SOB, and wheezing. She states that she has tried aleve with no relief for her symptoms. She denies hemoptysis, vomiting, diarrhea, rash, and any other symptoms. Pt denies hx of asthma or having an inhaler at home.    The history is provided by the patient. No language interpreter was used.    Past Medical History:  Diagnosis Date  . Allergy     There are no active problems to display for this patient.   Past Surgical History:  Procedure Laterality Date  . TUBAL LIGATION      OB History    No data available       Home Medications    Prior to Admission medications   Medication Sig Start Date End Date Taking? Authorizing Provider  cetirizine (ZYRTEC) 10 MG tablet Take 1 tablet (10 mg total) by mouth daily. 06/30/12   Shawnee Knapp, MD  fluticasone (FLONASE) 50 MCG/ACT nasal spray Place 2 sprays into the nose daily. 06/30/12   Shawnee Knapp, MD  ranitidine (ZANTAC) 300 MG tablet Take 1 tablet (300 mg total) by mouth at bedtime. 10/21/12   Mancel Bale, PA-C    Family History No family history on file.  Social History Social History  Substance Use Topics  . Smoking status: Never Smoker  . Smokeless tobacco: Not on file   . Alcohol use No     Allergies   Sulfa antibiotics; Cheese; and Chocolate   Review of Systems Review of Systems  Constitutional: Positive for fever (max of 102).  HENT: Positive for congestion, rhinorrhea and sneezing.   Respiratory: Positive for cough (dry), shortness of breath and wheezing.   Gastrointestinal: Negative for diarrhea and vomiting.  Musculoskeletal: Positive for myalgias.  Skin: Negative for rash.  Neurological: Positive for headaches.     Physical Exam Updated Vital Signs BP 137/85 (BP Location: Right Arm)   Pulse 96   Temp 101.5 F (38.6 C) (Oral)   Resp 18   SpO2 96%   Physical Exam  Constitutional: She is oriented to person, place, and time. She appears well-developed and well-nourished. No distress.  HENT:  Head: Normocephalic and atraumatic.  Right Ear: A middle ear effusion is present.  Left Ear: A middle ear effusion is present.  Nose: Nose normal.  Mouth/Throat: Uvula is midline, oropharynx is clear and moist and mucous membranes are normal.  Mild effusion noted to bilateral ears  Eyes: EOM are normal.  Neck: Neck supple.  Cardiovascular: Regular rhythm and normal heart sounds.  Tachycardia present.  Exam reveals no gallop and no friction rub.   No murmur heard. Mildly tachycardic  Pulmonary/Chest: Effort normal. No respiratory distress. She has wheezes. She has rhonchi. She has no rales.  Scattered rhonchi and faint expiratory wheezes  Abdominal: She exhibits no distension.  Musculoskeletal: Normal range of motion.  Neurological: She is alert and oriented to person, place, and time.  Skin: Skin is warm and dry.  Psychiatric: She has a normal mood and affect. Her behavior is normal.  Nursing note and vitals reviewed.    ED Treatments / Results  DIAGNOSTIC STUDIES: Oxygen Saturation is 96% on RA, nl by my interpretation.    COORDINATION OF CARE: 3:49 PM Discussed treatment plan with pt at bedside which includes CXR and pt agreed to  plan.   Radiology Dg Chest 2 View  Result Date: 03/01/2016 CLINICAL DATA:  Fever, cough and wheezing. EXAM: CHEST  2 VIEW COMPARISON:  12/19/2009 FINDINGS: Cardiomediastinal silhouette is normal. Mediastinal contours appear intact. There is no evidence of pleural effusion or pneumothorax. Subtle airspace opacity in the right middle lobe Osseous structures are without acute abnormality. Soft tissues are grossly normal. IMPRESSION: Subtle airspace opacity in the right middle lobe which may represent developing bronchopneumonia. Electronically Signed   By: Fidela Salisbury M.D.   On: 03/01/2016 16:24    Procedures Procedures (including critical care time)  Medications Ordered in ED Medications - No data to display   Initial Impression / Assessment and Plan / ED Course  I have reviewed the triage vital signs and the nursing notes.  Pertinent imaging results that were available during my care of the patient were reviewed by me and considered in my medical decision making (see chart for details).  Clinical Course     BP 137/85 (BP Location: Right Arm)   Pulse 96   Temp 101.5 F (38.6 C) (Oral)   Resp 18   SpO2 96%    Final Clinical Impressions(s) / ED Diagnoses   Final diagnoses:  Community acquired pneumonia of right middle lobe of lung (HCC)    New Prescriptions New Prescriptions   BENZONATATE (TESSALON) 100 MG CAPSULE    Take 1 capsule (100 mg total) by mouth every 8 (eight) hours.   LEVOFLOXACIN (LEVAQUIN) 750 MG TABLET    Take 1 tablet (750 mg total) by mouth daily. X 7 days   I personally performed the services described in this documentation, which was scribed in my presence. The recorded information has been reviewed and is accurate.   4:33 PM Pt here with viral syndrome, family members with same. However, she has rhonchi and wheezes on exam.  CXR showing potential PNA.  Will treat for CAP with levaquin.  Cough medication prescribe. Albuterol inhaler to use as  needed.  Return precaution discussed.     Domenic Moras, PA-C 03/01/16 1634    Blanchie Dessert, MD 03/01/16 2300

## 2016-03-01 NOTE — ED Triage Notes (Signed)
Pt states she has had fever 102.0, body aches, headache, dry cough, runny nose since yesterday. She has taken Aleve for her symptoms. She denies n/v/d. She has recent sick contacts at home. She did not receive the flu shot. She is alert, no acute distress. Skin warm, dry. Rates generalized pain 10/10.

## 2016-03-01 NOTE — ED Notes (Signed)
Patient transported to X-ray 

## 2016-06-13 ENCOUNTER — Other Ambulatory Visit: Payer: Self-pay | Admitting: Internal Medicine

## 2016-06-13 DIAGNOSIS — Z136 Encounter for screening for cardiovascular disorders: Secondary | ICD-10-CM | POA: Diagnosis not present

## 2016-06-13 DIAGNOSIS — Z1231 Encounter for screening mammogram for malignant neoplasm of breast: Secondary | ICD-10-CM

## 2016-06-13 DIAGNOSIS — Z131 Encounter for screening for diabetes mellitus: Secondary | ICD-10-CM | POA: Diagnosis not present

## 2016-06-13 DIAGNOSIS — M2010 Hallux valgus (acquired), unspecified foot: Secondary | ICD-10-CM | POA: Diagnosis not present

## 2016-06-13 DIAGNOSIS — G5602 Carpal tunnel syndrome, left upper limb: Secondary | ICD-10-CM | POA: Diagnosis not present

## 2016-06-13 DIAGNOSIS — Z13220 Encounter for screening for lipoid disorders: Secondary | ICD-10-CM | POA: Diagnosis not present

## 2016-06-26 DIAGNOSIS — G5600 Carpal tunnel syndrome, unspecified upper limb: Secondary | ICD-10-CM | POA: Diagnosis not present

## 2016-07-09 ENCOUNTER — Ambulatory Visit
Admission: RE | Admit: 2016-07-09 | Discharge: 2016-07-09 | Disposition: A | Discharge status: Home / Self Care | Payer: BLUE CROSS/BLUE SHIELD | Source: Ambulatory Visit | Attending: Internal Medicine | Admitting: Internal Medicine

## 2016-07-09 DIAGNOSIS — Z1231 Encounter for screening mammogram for malignant neoplasm of breast: Secondary | ICD-10-CM | POA: Diagnosis not present

## 2016-07-25 DIAGNOSIS — R87610 Atypical squamous cells of undetermined significance on cytologic smear of cervix (ASC-US): Secondary | ICD-10-CM | POA: Diagnosis not present

## 2016-07-25 DIAGNOSIS — N760 Acute vaginitis: Secondary | ICD-10-CM | POA: Diagnosis not present

## 2016-07-25 DIAGNOSIS — Z23 Encounter for immunization: Secondary | ICD-10-CM | POA: Diagnosis not present

## 2016-07-25 DIAGNOSIS — R7611 Nonspecific reaction to tuberculin skin test without active tuberculosis: Secondary | ICD-10-CM | POA: Diagnosis not present

## 2016-07-25 DIAGNOSIS — M2011 Hallux valgus (acquired), right foot: Secondary | ICD-10-CM | POA: Diagnosis not present

## 2016-07-25 DIAGNOSIS — Z124 Encounter for screening for malignant neoplasm of cervix: Secondary | ICD-10-CM | POA: Diagnosis not present

## 2016-07-25 DIAGNOSIS — G5603 Carpal tunnel syndrome, bilateral upper limbs: Secondary | ICD-10-CM | POA: Diagnosis not present

## 2016-07-26 ENCOUNTER — Encounter: Payer: Self-pay | Admitting: Gastroenterology

## 2016-08-13 DIAGNOSIS — G5603 Carpal tunnel syndrome, bilateral upper limbs: Secondary | ICD-10-CM | POA: Diagnosis not present

## 2016-08-13 DIAGNOSIS — M1810 Unilateral primary osteoarthritis of first carpometacarpal joint, unspecified hand: Secondary | ICD-10-CM | POA: Diagnosis not present

## 2016-09-17 ENCOUNTER — Ambulatory Visit (AMBULATORY_SURGERY_CENTER): Payer: Self-pay

## 2016-09-17 VITALS — Ht 67.0 in | Wt 165.0 lb

## 2016-09-17 DIAGNOSIS — Z1211 Encounter for screening for malignant neoplasm of colon: Secondary | ICD-10-CM

## 2016-09-17 MED ORDER — SUPREP BOWEL PREP KIT 17.5-3.13-1.6 GM/177ML PO SOLN: 1.0000 | Freq: Once | ORAL | 0 refills | Status: AC

## 2016-09-17 NOTE — Progress Notes (Signed)
No allergies to eggs or soy No diet meds No home oxygen No problems with anesthesia  Registered emmi

## 2016-09-30 ENCOUNTER — Encounter: Payer: Self-pay | Admitting: Gastroenterology

## 2016-09-30 ENCOUNTER — Ambulatory Visit (AMBULATORY_SURGERY_CENTER): Payer: BLUE CROSS/BLUE SHIELD | Admitting: Gastroenterology

## 2016-09-30 VITALS — BP 125/76 | HR 68 | Temp 97.5°F | Resp 16 | Ht 67.0 in | Wt 165.0 lb

## 2016-09-30 DIAGNOSIS — Z1211 Encounter for screening for malignant neoplasm of colon: Secondary | ICD-10-CM

## 2016-09-30 DIAGNOSIS — K621 Rectal polyp: Secondary | ICD-10-CM

## 2016-09-30 DIAGNOSIS — Z1212 Encounter for screening for malignant neoplasm of rectum: Secondary | ICD-10-CM

## 2016-09-30 DIAGNOSIS — D129 Benign neoplasm of anus and anal canal: Secondary | ICD-10-CM

## 2016-09-30 DIAGNOSIS — D128 Benign neoplasm of rectum: Secondary | ICD-10-CM

## 2016-09-30 MED ORDER — SODIUM CHLORIDE 0.9 % IV SOLN: 500.0000 mL | INTRAVENOUS | Status: AC

## 2016-09-30 NOTE — Op Note (Signed)
[facility] Patient Name: Anna Brooks Procedure Date: 09/30/2016 10:51 AM MRN: [id number] Endoscopist: Milus Banister , MD Age: 51 Referring MD:  Date of Birth: 03-26-1965 Gender: Female Account #: [id number] Procedure:                Colonoscopy Indications:              Screening for colorectal malignant neoplasm Medicines:                Monitored Anesthesia Care Procedure:                Pre-Anesthesia Assessment:                           - Prior to the procedure, a History and Physical                            was performed, and patient medications and                            allergies were reviewed. The patient's tolerance of                            previous anesthesia was also reviewed. The risks                            and benefits of the procedure and the sedation                            options and risks were discussed with the patient.                            All questions were answered, and informed consent                            was obtained. Prior Anticoagulants: The patient has                            taken no previous anticoagulant or antiplatelet                            agents. ASA Grade Assessment: II - A patient with                            mild systemic disease. After reviewing the risks                            and benefits, the patient was deemed in                            satisfactory condition to undergo the procedure.                           After obtaining informed consent, the colonoscope  was passed under direct vision. Throughout the                            procedure, the patient's blood pressure, pulse, and                            oxygen saturations were monitored continuously. The                            Colonoscope was introduced through the anus and                            advanced to the the cecum, identified by                            appendiceal orifice and  ileocecal valve. The                            colonoscopy was performed without difficulty. The                            patient tolerated the procedure well. The quality                            of the bowel preparation was good. The ileocecal                            valve, appendiceal orifice, and rectum were                            photographed. Scope In: 10:54:44 AM Scope Out: 11:05:34 AM Scope Withdrawal Time: 0 hours 7 minutes 37 seconds  Total Procedure Duration: 0 hours 10 minutes 50 seconds  Findings:                 A 4 mm polyp was found in the rectum. The polyp was                            sessile. The polyp was removed with a cold snare.                            Resection and retrieval were complete.                           The exam was otherwise without abnormality on                            direct and retroflexion views. Complications:            No immediate complications. Estimated blood loss:                            None. Estimated Blood Loss:     Estimated blood loss: none. Impression:               -  One 4 mm polyp in the rectum, removed with a cold                            snare. Resected and retrieved.                           - The examination was otherwise normal on direct                            and retroflexion views. Recommendation:           - Patient has a contact number available for                            emergencies. The signs and symptoms of potential                            delayed complications were discussed with the                            patient. Return to normal activities tomorrow.                            Written discharge instructions were provided to the                            patient.                           - Resume previous diet.                           - Continue present medications.                           You will receive a letter within 2-3 weeks with the                             pathology results and my final recommendations.                           If the polyp(s) is proven to be 'pre-cancerous' on                            pathology, you will need repeat colonoscopy in 5                            years. If the polyp(s) is NOT 'precancerous' on                            pathology then you should repeat colon cancer                            screening in 10 years with colonoscopy without need  for colon cancer screening by any method prior to                            then (including stool testing). Milus Banister, MD 09/30/2016 11:07:21 AM This report has been signed electronically.

## 2016-09-30 NOTE — Patient Instructions (Signed)
Impression/Recommendations:  Polyp handout given to patient. Resume previous diet.  Continue present medications.  Repeat colonoscopy in 5-10 years based on pathology report.  YOU HAD AN ENDOSCOPIC PROCEDURE TODAY AT [facility]:   Refer to the procedure report that was given to you for any specific questions about what was found during the examination.  If the procedure report does not answer your questions, please call your gastroenterologist to clarify.  If you requested that your care partner not be given the details of your procedure findings, then the procedure report has been included in a sealed envelope for you to review at your convenience later.  YOU SHOULD EXPECT: Some feelings of bloating in the abdomen. Passage of more gas than usual.  Walking can help get rid of the air that was put into your GI tract during the procedure and reduce the bloating. If you had a lower endoscopy (such as a colonoscopy or flexible sigmoidoscopy) you may notice spotting of blood in your stool or on the toilet paper. If you underwent a bowel prep for your procedure, you may not have a normal bowel movement for a few days.  Please Note:  You might notice some irritation and congestion in your nose or some drainage.  This is from the oxygen used during your procedure.  There is no need for concern and it should clear up in a day or so.  SYMPTOMS TO REPORT IMMEDIATELY:   Following lower endoscopy (colonoscopy or flexible sigmoidoscopy):  Excessive amounts of blood in the stool  Significant tenderness or worsening of abdominal pains  Swelling of the abdomen that is new, acute  Fever of 100F or higher For urgent or emergent issues, a gastroenterologist can be reached at any hour by calling [phone].   DIET:  We do recommend a small meal at first, but then you may proceed to your regular diet.  Drink plenty of fluids but you should avoid alcoholic beverages for 24  hours.  ACTIVITY:  You should plan to take it easy for the rest of today and you should NOT DRIVE or use heavy machinery until tomorrow (because of the sedation medicines used during the test).    FOLLOW UP: Our staff will call the number listed on your records the next business day following your procedure to check on you and address any questions or concerns that you may have regarding the information given to you following your procedure. If we do not reach you, we will leave a message.  However, if you are feeling well and you are not experiencing any problems, there is no need to return our call.  We will assume that you have returned to your regular daily activities without incident.  If any biopsies were taken you will be contacted by phone or by letter within the next 1-3 weeks.  Please call us at [phone] if you have not heard about the biopsies in 3 weeks.    SIGNATURES/CONFIDENTIALITY: You and/or your care partner have signed paperwork which will be entered into your electronic medical record.  These signatures attest to the fact that that the information above on your After Visit Summary has been reviewed and is understood.  Full responsibility of the confidentiality of this discharge information lies with you and/or your care-partner.

## 2016-09-30 NOTE — Progress Notes (Signed)
A/ox3, pleased with MAC, report to RN 

## 2016-09-30 NOTE — Progress Notes (Signed)
Called to room to assist during endoscopic procedure.  Patient ID and intended procedure confirmed with present staff. Received instructions for my participation in the procedure from the performing physician.  

## 2016-10-01 ENCOUNTER — Telehealth: Payer: Self-pay | Admitting: *Deleted

## 2016-10-01 NOTE — Telephone Encounter (Signed)
Left message on f/u call 

## 2016-10-01 NOTE — Telephone Encounter (Signed)
  Follow up Call-  Call back number 09/30/2016  Post procedure Call Back phone  # [phone].  [phone] cell  Permission to leave phone message Yes  Some recent data might be hidden     Patient questions:  Do you have a fever, pain , or abdominal swelling? No. Pain Score  0 *  Have you tolerated food without any problems? Yes.    Have you been able to return to your normal activities? Yes.    Do you have any questions about your discharge instructions: Diet   No. Medications  No. Follow up visit  No.  Do you have questions or concerns about your Care? No.  Actions: * If pain score is 4 or above: No action needed, pain <4.

## 2016-10-01 NOTE — Telephone Encounter (Signed)
Says she was sleeping when we called. Is doing well. No problems. Advised her to call our office with any problems or concerns. Verbalized understanding.

## 2016-10-04 ENCOUNTER — Encounter: Payer: Self-pay | Admitting: Gastroenterology

## 2017-02-03 ENCOUNTER — Other Ambulatory Visit: Payer: Self-pay | Admitting: Occupational Medicine

## 2017-02-03 ENCOUNTER — Ambulatory Visit: Payer: Self-pay

## 2017-02-03 DIAGNOSIS — M79661 Pain in right lower leg: Secondary | ICD-10-CM

## 2017-07-24 DIAGNOSIS — Z13220 Encounter for screening for lipoid disorders: Secondary | ICD-10-CM | POA: Diagnosis not present

## 2017-07-24 DIAGNOSIS — Z124 Encounter for screening for malignant neoplasm of cervix: Secondary | ICD-10-CM | POA: Diagnosis not present

## 2017-07-24 DIAGNOSIS — Z0001 Encounter for general adult medical examination with abnormal findings: Secondary | ICD-10-CM | POA: Diagnosis not present

## 2017-07-24 DIAGNOSIS — Z91018 Allergy to other foods: Secondary | ICD-10-CM | POA: Diagnosis not present

## 2017-07-24 DIAGNOSIS — Z131 Encounter for screening for diabetes mellitus: Secondary | ICD-10-CM | POA: Diagnosis not present

## 2017-07-24 DIAGNOSIS — N760 Acute vaginitis: Secondary | ICD-10-CM | POA: Diagnosis not present

## 2017-07-24 DIAGNOSIS — Z0000 Encounter for general adult medical examination without abnormal findings: Secondary | ICD-10-CM | POA: Diagnosis not present

## 2017-08-18 ENCOUNTER — Encounter: Payer: Self-pay | Admitting: Allergy & Immunology

## 2017-08-20 ENCOUNTER — Other Ambulatory Visit: Payer: Self-pay | Admitting: Internal Medicine

## 2017-08-20 DIAGNOSIS — Z1231 Encounter for screening mammogram for malignant neoplasm of breast: Secondary | ICD-10-CM

## 2017-09-10 ENCOUNTER — Inpatient Hospital Stay: Admission: RE | Admit: 2017-09-10 | Payer: Self-pay | Source: Ambulatory Visit

## 2017-09-23 ENCOUNTER — Ambulatory Visit
Admission: RE | Admit: 2017-09-23 | Discharge: 2017-09-23 | Disposition: A | Discharge status: Home / Self Care | Payer: BLUE CROSS/BLUE SHIELD | Source: Ambulatory Visit | Attending: Internal Medicine | Admitting: Internal Medicine

## 2017-09-23 DIAGNOSIS — Z1231 Encounter for screening mammogram for malignant neoplasm of breast: Secondary | ICD-10-CM | POA: Diagnosis not present

## 2017-10-06 ENCOUNTER — Ambulatory Visit: Payer: BLUE CROSS/BLUE SHIELD | Admitting: Allergy and Immunology

## 2017-10-23 DIAGNOSIS — J302 Other seasonal allergic rhinitis: Secondary | ICD-10-CM | POA: Diagnosis not present

## 2017-10-23 DIAGNOSIS — E2839 Other primary ovarian failure: Secondary | ICD-10-CM | POA: Diagnosis not present

## 2017-10-23 DIAGNOSIS — G5603 Carpal tunnel syndrome, bilateral upper limbs: Secondary | ICD-10-CM | POA: Diagnosis not present

## 2017-10-23 DIAGNOSIS — Z91018 Allergy to other foods: Secondary | ICD-10-CM | POA: Diagnosis not present

## 2017-10-24 ENCOUNTER — Other Ambulatory Visit: Payer: Self-pay | Admitting: Internal Medicine

## 2017-10-24 DIAGNOSIS — E2839 Other primary ovarian failure: Secondary | ICD-10-CM

## 2017-11-10 ENCOUNTER — Ambulatory Visit: Payer: BLUE CROSS/BLUE SHIELD | Admitting: Allergy and Immunology

## 2017-11-10 ENCOUNTER — Encounter (INDEPENDENT_AMBULATORY_CARE_PROVIDER_SITE_OTHER): Payer: Self-pay

## 2017-12-09 ENCOUNTER — Encounter: Payer: Self-pay | Admitting: Allergy and Immunology

## 2017-12-09 ENCOUNTER — Ambulatory Visit: Payer: 59 | Admitting: Allergy and Immunology

## 2017-12-09 ENCOUNTER — Ambulatory Visit: Payer: Self-pay

## 2017-12-09 VITALS — BP 112/80 | HR 94 | Temp 98.6°F | Resp 18 | Ht 66.0 in | Wt 160.8 lb

## 2017-12-09 DIAGNOSIS — H1010 Acute atopic conjunctivitis, unspecified eye: Secondary | ICD-10-CM | POA: Insufficient documentation

## 2017-12-09 DIAGNOSIS — H1013 Acute atopic conjunctivitis, bilateral: Secondary | ICD-10-CM

## 2017-12-09 DIAGNOSIS — T7800XA Anaphylactic reaction due to unspecified food, initial encounter: Secondary | ICD-10-CM | POA: Insufficient documentation

## 2017-12-09 DIAGNOSIS — T7800XD Anaphylactic reaction due to unspecified food, subsequent encounter: Secondary | ICD-10-CM | POA: Diagnosis not present

## 2017-12-09 DIAGNOSIS — J3089 Other allergic rhinitis: Secondary | ICD-10-CM

## 2017-12-09 DIAGNOSIS — T781XXA Other adverse food reactions, not elsewhere classified, initial encounter: Secondary | ICD-10-CM | POA: Insufficient documentation

## 2017-12-09 DIAGNOSIS — T781XXD Other adverse food reactions, not elsewhere classified, subsequent encounter: Secondary | ICD-10-CM

## 2017-12-09 MED ORDER — OLOPATADINE HCL 0.2 % OP SOLN: 1.0000 [drp] | Freq: Every day | OPHTHALMIC | 5 refills | Status: DC | PRN

## 2017-12-09 MED ORDER — LEVOCETIRIZINE DIHYDROCHLORIDE 5 MG PO TABS: 5.0000 mg | ORAL_TABLET | Freq: Every evening | ORAL | 5 refills | Status: DC

## 2017-12-09 MED ORDER — EPINEPHRINE 0.3 MG/0.3ML IJ SOAJ: 0.3000 mg | Freq: Once | INTRAMUSCULAR | 2 refills | Status: AC

## 2017-12-09 MED ORDER — AZELASTINE-FLUTICASONE 137-50 MCG/ACT NA SUSP: 2.0000 | NASAL | 5 refills | Status: DC

## 2017-12-09 NOTE — Assessment & Plan Note (Addendum)
Oral allergy syndrome.  The patient's history and skin test results support a diagnosis of oral allergy syndrome (OAS). Peeling or cooking the food has shown to reduce symptoms and antihistamines may also relieve symptoms. Immunotherapy to the cross reacting pollens has improved or cured OAS in many patients, though this has not been consistent for all patients. Typically OAS is limited to itching or swelling of mucosal tissues from the lips to the back of the throat.   Information about OAS has been discussed and provided in written form.  All foods causing symptoms are to be avoided.  Should symptoms progress beyond the mouth and throat, epinephrine is to be administered and 911 is to be called immediately.

## 2017-12-09 NOTE — Patient Instructions (Addendum)
Allergic rhinitis  Aeroallergen avoidance measures have been discussed and provided in written form.  A prescription has been provided for levocetirizine, 5 mg daily as needed.  A prescription has been provided for Dymista (azelastine/fluticasone) nasal spray, 1 spray per nostril twice daily as needed. Proper nasal spray technique has been discussed and demonstrated.  Nasal saline spray (i.e., Simply Saline) or nasal saline lavage (i.e., NeilMed) is recommended as needed and prior to medicated nasal sprays.  The risks and benefits of aeroallergen immunotherapy have been discussed. The patient is motivated to initiate immunotherapy if insurance coverage is favorable. She will let us know how she would like to proceed.  Allergic conjunctivitis  Treatment plan as outlined above for allergic rhinitis.  A prescription has been provided for Pataday, one drop per eye daily as needed.  I have also recommended eye lubricant drops (i.e., Natural Tears) as needed.  Food allergy The patient's history suggests food allergy to milk, peanut, tree nuts, shellfish, and fish.  However, food allergen skin test today were negative despite a positive histamine control.  The negative predictive value for food allergen skin testing is excellent, however there is still a 5% chance that the allergy exists.  A laboratory order form has been provided for baseline serum tryptase level and serum specific IgE against cows milk, cows milk components, peanut, peanut components, tree nut panel with reflex components, shellfish panel, fish panel, and alpha gal panel.  For now, I have recommended avoidance of dairy products, peanut, tree nuts, fish, and shellfish.  A prescription has been provided for epinephrine 0.3 mg autoinjector 2 pack along with instructions for its proper administration.  Oral allergy syndrome Oral allergy syndrome.  The patient's history and skin test results support a diagnosis of oral allergy  syndrome (OAS). Peeling or cooking the food has shown to reduce symptoms and antihistamines may also relieve symptoms. Immunotherapy to the cross reacting pollens has improved or cured OAS in many patients, though this has not been consistent for all patients. Typically OAS is limited to itching or swelling of mucosal tissues from the lips to the back of the throat.   Information about OAS has been discussed and provided in written form.  All foods causing symptoms are to be avoided.  Should symptoms progress beyond the mouth and throat, epinephrine is to be administered and 911 is to be called immediately.   When lab results have returned the patient will be called with further recommendations. Return for follow-up in 3 months or sooner if needed.   Reducing Pollen Exposure  The American Academy of Allergy, Asthma and Immunology suggests the following steps to reduce your exposure to pollen during allergy seasons.    1. Do not hang sheets or clothing out to dry; pollen may collect on these items. 2. Do not mow lawns or spend time around freshly cut grass; mowing stirs up pollen. 3. Keep windows closed at night.  Keep car windows closed while driving. 4. Minimize morning activities outdoors, a time when pollen counts are usually at their highest. 5. Stay indoors as much as possible when pollen counts or humidity is high and on windy days when pollen tends to remain in the air longer. 6. Use air conditioning when possible.  Many air conditioners have filters that trap the pollen spores. 7. Use a HEPA room air filter to remove pollen form the indoor air you breathe.   Control of Dog or Cat Allergen  Avoidance is the best way to manage a  dog or cat allergy. If you have a dog or cat and are allergic to dog or cats, consider removing the dog or cat from the home. If you have a dog or cat but don't want to find it a new home, or if your family wants a pet even though someone in the household is  allergic, here are some strategies that may help keep symptoms at bay:  1. Keep the pet out of your bedroom and restrict it to only a few rooms. Be advised that keeping the dog or cat in only one room will not limit the allergens to that room. 2. Don't pet, hug or kiss the dog or cat; if you do, wash your hands with soap and water. 3. High-efficiency particulate air (HEPA) cleaners run continuously in a bedroom or living room can reduce allergen levels over time. 4. Place electrostatic material sheet in the air inlet vent in the bedroom. 5. Regular use of a high-efficiency vacuum cleaner or a central vacuum can reduce allergen levels. 6. Giving your dog or cat a bath at least once a week can reduce airborne allergen.     Oral Allergy Syndrome (OAS)  Oral Allergy Syndrome or OAS is an allergic reaction to certain (usually fresh) fruits, nuts, and vegetables. The allergy is not actually an allergy to food but a syndrome that develops in pollen allergy sufferers. The immune system mistakes the food proteins for the pollen proteins and causes an allergic reaction. For instance, an allergy to ragweed is associated with OAS reactions to banana, watermelon, cantaloupe, honeydew, zucchini, and cucumber. This does not mean that all sufferers of an allergy to ragweed will experience adverse effects from all or even any of these foods. Reactions may begin with one type of food and with reactions to others developing later. However, reaction to one or more foods in any given category does not necessarily mean a person is allergic to all foods in that group. OAS sufferers may have a number of reactions that usually occur very rapidly, within minutes of eating a trigger food. The most common reaction is an itching or burning sensation in the lips, mouth, and/or pharynx. Sometimes other reactions can be triggered in the eyes, nose, and skin. The most severe reactions can result in asthma problems or anaphylaxis.  If  a sufferer is able to swallow the food, there is a good chance that there will be a reaction later in the gastrointestinal tract. Vomiting, diarrhea, severe indigestion, or cramps may occur.  Treatment: An OAS sufferer should avoid foods to which they are allergic. Peeling or cooking the food has shown to reduce symptoms in the throat and mouth, but may not relieve symptoms in the gastrointestinal tract. Antihistamines may also relieve the symptoms of the allergy. Persons with severe reactions may consider carrying injectable epinephrine should systemic symptoms occur. Allergy immunotherapy to the pollens has improved or cured OAS in many patients, though this has not been consistent for all patients. Dione Housekeeper pollen: almonds, apples, celery, cherries, hazel nuts, peaches, pears, parsley, strawberry, raspberry . Birch pollen: almonds, apples, apricots, avocados, bananas, carrots, celery, cherries, chicory, coriander, fennel, fig, hazel nuts, kiwifruit, nectarines, parsley, parsnips, peaches, pears, peppers, plums, potatoes, prunes, soy, strawberries, wheat; Potential: walnuts . Grass pollen: fig, melons, tomatoes, oranges . Mugwort pollen : carrots, celery, coriander, fennel, parsley, peppers, sunflower . Ragweed pollen : banana, cantaloupe, cucumber, green pepper, paprika, sunflower seeds/oil, honeydew, watermelon, zucchini, echinacea, artichoke, dandelions, honey (if bees pollinate from wild flowers),  hibiscus or chamomile tea . Possible cross-reactions (to any of the above): berries (strawberries, blueberries, raspberries, etc), citrus (oranges, lemons, etc), grapes, mango, figs, peanut, pineapple, pomegranates, watermelon

## 2017-12-09 NOTE — Progress Notes (Signed)
New Patient Note  RE: Anna Brooks MRN: [id number] DOB: October 01, 1965 Date of Office Visit: 12/09/2017  Referring provider: Nolene Ebbs, MD Primary care provider: Nolene Ebbs, MD  Chief Complaint: Allergic Rhinitis ; Conjunctivitis; and Food Intolerance   History of present illness: Anna Brooks is a 52 y.o. female seen today in consultation requested by Nolene Ebbs, MD.  She complains of severe nasal congestion, rhinorrhea, sneezing, postnasal drainage, nasal pruritus, and ocular pruritus.  These symptoms occur year around but are more frequent and severe during the springtime and in the fall.  She attempts to control the symptoms without adequate symptom relief with cetirizine and/or diphenhydramine. She complains that when she consumes apples, strawberries, and pineapples she experiences oral pruritus.  She does not experience concomitant cardiopulmonary symptoms or GI symptoms.  She notes that approximately 1 year ago she consumed cheese and experience generalized pruritus.  At that time, she did not experience concomitant angioedema, cardiopulmonary symptoms, or GI symptoms.  Yesterday, she consumed cheddar cheese potato chips and experienced oral pruritus, generalized pruritus, and urticaria.  In addition, she also experienced some nausea yesterday as well.  She also reports that over the past year she has she experienced oral pruritus with the consumption of peanuts, tree nuts, fish, and shellfish.  Assessment and plan: Allergic rhinitis  Aeroallergen avoidance measures have been discussed and provided in written form.  A prescription has been provided for levocetirizine, 5 mg daily as needed.  A prescription has been provided for Dymista (azelastine/fluticasone) nasal spray, 1 spray per nostril twice daily as needed. Proper nasal spray technique has been discussed and demonstrated.  Nasal saline spray (i.e., Simply Saline) or nasal saline lavage (i.e., NeilMed) is recommended as  needed and prior to medicated nasal sprays.  The risks and benefits of aeroallergen immunotherapy have been discussed. The patient is motivated to initiate immunotherapy if insurance coverage is favorable. She will let us know how she would like to proceed.  Allergic conjunctivitis  Treatment plan as outlined above for allergic rhinitis.  A prescription has been provided for Pataday, one drop per eye daily as needed.  I have also recommended eye lubricant drops (i.e., Natural Tears) as needed.  Food allergy The patient's history suggests food allergy to milk, peanut, tree nuts, shellfish, and fish.  However, food allergen skin test today were negative despite a positive histamine control.  The negative predictive value for food allergen skin testing is excellent, however there is still a 5% chance that the allergy exists.  A laboratory order form has been provided for baseline serum tryptase level and serum specific IgE against cows milk, cows milk components, peanut, peanut components, tree nut panel with reflex components, shellfish panel, fish panel, and alpha gal panel.  For now, I have recommended avoidance of dairy products, peanut, tree nuts, fish, and shellfish.  A prescription has been provided for epinephrine 0.3 mg autoinjector 2 pack along with instructions for its proper administration.  Oral allergy syndrome Oral allergy syndrome.  The patient's history and skin test results support a diagnosis of oral allergy syndrome (OAS). Peeling or cooking the food has shown to reduce symptoms and antihistamines may also relieve symptoms. Immunotherapy to the cross reacting pollens has improved or cured OAS in many patients, though this has not been consistent for all patients. Typically OAS is limited to itching or swelling of mucosal tissues from the lips to the back of the throat.   Information about OAS has been discussed and provided in written  form.  All foods causing symptoms are  to be avoided.  Should symptoms progress beyond the mouth and throat, epinephrine is to be administered and 911 is to be called immediately.   Meds ordered this encounter  Medications  . levocetirizine (XYZAL) 5 MG tablet    Sig: Take 1 tablet (5 mg total) by mouth every evening.    Dispense:  30 tablet    Refill:  5  . Azelastine-Fluticasone (DYMISTA) 137-50 MCG/ACT SUSP    Sig: Place 2 sprays into both nostrils 1 day or 1 dose.    Dispense:  1 Bottle    Refill:  5  . Olopatadine HCl (PATADAY) 0.2 % SOLN    Sig: Place 1 drop into both eyes daily as needed.    Dispense:  1 Bottle    Refill:  5  . EPINEPHrine (AUVI-Q) 0.3 mg/0.3 mL IJ SOAJ injection    Sig: Inject 0.3 mLs (0.3 mg total) into the muscle once for 1 dose. As directed for life-threatening allergic reactions    Dispense:  4 Device    Refill:  2    Please call [phone] for delivery.    Diagnostics: Environmental skin testing: Robust reactivity to tree pollen.  Positive to grass pollen, ragweed pollen, weed pollen, and dog epithelia. Food allergen skin testing: Negative despite a positive histamine control.    Physical examination: Blood pressure 112/80, pulse 94, temperature 98.6 F (37 C), temperature source Oral, resp. rate 18, height 5\' 6"  (1.676 m), weight 160 lb 12.8 oz (72.9 kg), SpO2 97 %.  General: Alert, interactive, in no acute distress. HEENT: TMs pearly gray, turbinates moderately edematous with clear discharge, post-pharynx erythematous. Neck: Supple without lymphadenopathy. Lungs: Clear to auscultation without wheezing, rhonchi or rales. CV: Normal S1, S2 without murmurs. Abdomen: Nondistended, nontender. Skin: Warm and dry, without lesions or rashes. Extremities:  No clubbing, cyanosis or edema. Neuro:   Grossly intact.  Review of systems:  Review of systems negative except as noted in HPI / PMHx or noted below: Review of Systems  Constitutional: Negative.   HENT: Negative.   Eyes:  Negative.   Respiratory: Negative.   Cardiovascular: Negative.   Gastrointestinal: Negative.   Genitourinary: Negative.   Musculoskeletal: Negative.   Skin: Negative.   Neurological: Negative.   Endo/Heme/Allergies: Negative.   Psychiatric/Behavioral: Negative.     Past medical history:  Past Medical History:  Diagnosis Date  . Allergy   . GERD (gastroesophageal reflux disease)     Past surgical history:  Past Surgical History:  Procedure Laterality Date  . APPENDECTOMY    . DENTAL RESTORATION/EXTRACTION WITH X-RAY    . TUBAL LIGATION      Family history: Family History  Problem Relation Age of Onset  . Breast cancer Neg Hx   . Colon cancer Neg Hx     Social history: Social History   Socioeconomic History  . Marital status: Married    Spouse name: Not on file  . Number of children: Not on file  . Years of education: Not on file  . Highest education level: Not on file  Occupational History  . Occupation: Forensic psychologist: [employer]  . Financial resource strain: Not on file  . Food insecurity:    Worry: Not on file    Inability: Not on file  . Transportation needs:    Medical: Not on file    Non-medical: Not on file  Tobacco Use  . Smoking status:  Never Smoker  . Smokeless tobacco: Never Used  Substance and Sexual Activity  . Alcohol use: No  . Drug use: No  . Sexual activity: Yes  Lifestyle  . Physical activity:    Days per week: Not on file    Minutes per session: Not on file  . Stress: Not on file  Relationships  . Social connections:    Talks on phone: Not on file    Gets together: Not on file    Attends religious service: Not on file    Active member of club or organization: Not on file    Attends meetings of clubs or organizations: Not on file    Relationship status: Not on file  . Intimate partner violence:    Fear of current or ex partner: Not on file    Emotionally abused: Not on file    Physically abused: Not on  file    Forced sexual activity: Not on file  Other Topics Concern  . Not on file  Social History Narrative   From [place] - Came to [place] in [place] History: The patient lives in a house with gas heat and central air.  There is no known mold/water damage in the home.  She is a non-smoker without pets.  Allergies as of 12/09/2017      Reactions   Codeine Itching   Tetracyclines & Related    Cheese Itching, Rash   Chocolate Itching, Rash   Penicillin G Rash   Sulfa Antibiotics Rash      Medication List        Accurate as of 12/09/17  4:53 PM. Always use your most recent med list.          Azelastine-Fluticasone 137-50 MCG/ACT Susp Place 2 sprays into both nostrils 1 day or 1 dose.   cetirizine 10 MG tablet Commonly known as:  ZYRTEC Take 1 tablet (10 mg total) by mouth daily.   EPINEPHrine 0.3 mg/0.3 mL Soaj injection Commonly known as:  EPI-PEN Inject 0.3 mLs (0.3 mg total) into the muscle once for 1 dose. As directed for life-threatening allergic reactions   levocetirizine 5 MG tablet Commonly known as:  XYZAL Take 1 tablet (5 mg total) by mouth every evening.   Olopatadine HCl 0.2 % Soln Place 1 drop into both eyes daily as needed.   omeprazole 20 MG capsule Commonly known as:  PRILOSEC Take 20 mg by mouth daily.   triamcinolone cream 0.5 % Commonly known as:  KENALOG triamcinolone acetonide 0.5 % topical cream  APPLY TWICE A DAY AS NEEDED       Known medication allergies: Allergies  Allergen Reactions  . Codeine Itching  . Tetracyclines & Related   . Cheese Itching and Rash  . Chocolate Itching and Rash  . Penicillin G Rash  . Sulfa Antibiotics Rash    I appreciate the opportunity to take part in Trixy's care. Please do not hesitate to contact me with questions.  Sincerely,   R. Edgar Frisk, MD

## 2017-12-09 NOTE — Assessment & Plan Note (Signed)
   Treatment plan as outlined above for allergic rhinitis.  A prescription has been provided for Pataday, one drop per eye daily as needed.  I have also recommended eye lubricant drops (i.e., Natural Tears) as needed. 

## 2017-12-09 NOTE — Assessment & Plan Note (Signed)
The patient's history suggests food allergy to milk, peanut, tree nuts, shellfish, and fish.  However, food allergen skin test today were negative despite a positive histamine control.  The negative predictive value for food allergen skin testing is excellent, however there is still a 5% chance that the allergy exists.  A laboratory order form has been provided for baseline serum tryptase level and serum specific IgE against cows milk, cows milk components, peanut, peanut components, tree nut panel with reflex components, shellfish panel, fish panel, and alpha gal panel.  For now, I have recommended avoidance of dairy products, peanut, tree nuts, fish, and shellfish.  A prescription has been provided for epinephrine 0.3 mg autoinjector 2 pack along with instructions for its proper administration.

## 2017-12-09 NOTE — Assessment & Plan Note (Addendum)
   Aeroallergen avoidance measures have been discussed and provided in written form.  A prescription has been provided for levocetirizine, 5 mg daily as needed.  A prescription has been provided for Dymista (azelastine/fluticasone) nasal spray, 1 spray per nostril twice daily as needed. Proper nasal spray technique has been discussed and demonstrated.  Nasal saline spray (i.e., Simply Saline) or nasal saline lavage (i.e., NeilMed) is recommended as needed and prior to medicated nasal sprays.  The risks and benefits of aeroallergen immunotherapy have been discussed. The patient is motivated to initiate immunotherapy if insurance coverage is favorable. She will let us know how she would like to proceed.

## 2017-12-13 LAB — ALLERGEN PROFILE, SHELLFISH
Clam IgE: <0.1 kU/L
F023-IgE Crab: <0.1 kU/L
F080-IgE Lobster: <0.1 kU/L
F290-IgE Oyster: <0.1 kU/L
Scallop IgE: <0.1 kU/L
Shrimp IgE: <0.1 kU/L

## 2017-12-13 LAB — IGE PEANUT COMPONENT PROFILE
F352-IgE Ara h 8: 0.4 kU/L — AB
F422-IgE Ara h 1: <0.1 kU/L
F423-IgE Ara h 2: <0.1 kU/L
F424-IgE Ara h 3: <0.1 kU/L
F427-IgE Ara h 9: <0.1 kU/L
F447-IgE Ara h 6: <0.1 kU/L

## 2017-12-13 LAB — MILK COMPONENT PANEL
F076-IgE Alpha Lactalbumin: <0.1 kU/L
F077-IgE Beta Lactoglobulin: <0.1 kU/L
F078-IgE Casein: <0.1 kU/L

## 2017-12-13 LAB — IGE NUT PROF. W/COMPONENT RFLX
Brazil Nut IgE: <0.1 kU/L
F020-IgE Almond: 0.2 kU/L — AB
F202-IgE Cashew Nut: <0.1 kU/L
F203-IgE Pistachio Nut: <0.1 kU/L
Hazelnut (Filbert) IgE: 1.76 kU/L — AB
Macadamia Nut, IgE: 0.19 kU/L — AB
Peanut, IgE: <0.1 kU/L
Pecan Nut IgE: <0.1 kU/L
Walnut IgE: <0.1 kU/L

## 2017-12-13 LAB — ALPHA-GAL PANEL
Alpha Gal IgE*: <0.1 kU/L (ref <0.10)
Beef (Bos spp) IgE: <0.1 kU/L (ref <0.35)
Class Interpretation: 0
Class Interpretation: 0
Class Interpretation: 0
Lamb/Mutton (Ovis spp) IgE: <0.1 kU/L (ref <0.35)
Pork (Sus spp) IgE: <0.1 kU/L (ref <0.35)

## 2017-12-13 LAB — ALLERGEN PROFILE, FOOD-FISH
Allergen Mackerel IgE: <0.1 kU/L
Allergen Salmon IgE: <0.1 kU/L
Allergen Trout IgE: <0.1 kU/L
Allergen Walley Pike IgE: <0.1 kU/L
Codfish IgE: <0.1 kU/L
Halibut IgE: <0.1 kU/L
Tuna: <0.1 kU/L

## 2017-12-13 LAB — PANEL 604726
Cor A 1 IgE: 2.53 kU/L — AB
Cor A 14 IgE: <0.1 kU/L
Cor A 8 IgE: <0.1 kU/L
Cor A 9 IgE: <0.1 kU/L

## 2017-12-13 LAB — ALLERGEN MILK: Milk IgE: <0.1 kU/L

## 2017-12-13 LAB — ALLERGEN, PEANUT F13

## 2017-12-19 ENCOUNTER — Other Ambulatory Visit: Payer: Self-pay | Admitting: Allergy and Immunology

## 2017-12-22 ENCOUNTER — Telehealth: Payer: Self-pay | Admitting: *Deleted

## 2017-12-22 MED ORDER — AZELASTINE HCL 0.1 % NA SOLN: 2.0000 | Freq: Two times a day (BID) | NASAL | 5 refills | Status: DC

## 2017-12-22 MED ORDER — FLUTICASONE PROPIONATE 50 MCG/ACT NA SUSP: 2.0000 | Freq: Every day | NASAL | 5 refills | Status: DC

## 2017-12-22 NOTE — Telephone Encounter (Signed)
Alternative medication sent.

## 2017-12-22 NOTE — Telephone Encounter (Signed)
Mailed letter to patient's home to call office regarding blood test.

## 2017-12-22 NOTE — Telephone Encounter (Signed)
-----   Message from Adelina Mings, MD sent at 12/15/2017  8:46 AM EDT ----- Labs further support oral allergy syndrome rather than anaphylaxis related food allergy.  If the patient would like to do an open graded oral challenge, we can arrange that.  Please send results of the patient's primary care physician/referring physician.  Thank you.

## 2017-12-26 NOTE — Telephone Encounter (Signed)
Informed patient of blood test results. Told her to discuss the oral challenge with Dr. Verlin Fester at her follow up. Also scheduled appointment for patient to start injections.

## 2017-12-30 NOTE — Progress Notes (Signed)
VIALS EXP 01-01-19

## 2018-01-01 DIAGNOSIS — J301 Allergic rhinitis due to pollen: Secondary | ICD-10-CM | POA: Diagnosis not present

## 2018-01-02 DIAGNOSIS — J3081 Allergic rhinitis due to animal (cat) (dog) hair and dander: Secondary | ICD-10-CM | POA: Diagnosis not present

## 2018-01-14 ENCOUNTER — Encounter: Payer: 59 | Admitting: *Deleted

## 2018-01-14 MED ORDER — EPINEPHRINE 0.3 MG/0.3ML IJ SOAJ: INTRAMUSCULAR | 2 refills | Status: AC

## 2018-01-15 NOTE — Progress Notes (Signed)
Patient came in to start allergy injections. Writer was reviewing allergy injection protocol and guidelines when patient realized it was a commitment of coming 1-2 times a week she refused starting allergy shots. States she was told that this would be a shot she gets every 3 months advised patient that is not the case build up is weekly until maintenance at that time frequency would change also therapy last for 3-5 years. Patient was not ok with this and refused.  This encounter was created in error - please disregard.

## 2018-03-09 ENCOUNTER — Ambulatory Visit (INDEPENDENT_AMBULATORY_CARE_PROVIDER_SITE_OTHER): Payer: 59 | Admitting: Allergy and Immunology

## 2018-03-09 ENCOUNTER — Encounter: Payer: Self-pay | Admitting: Allergy and Immunology

## 2018-03-09 VITALS — BP 114/80 | HR 80 | Resp 16

## 2018-03-09 DIAGNOSIS — H1013 Acute atopic conjunctivitis, bilateral: Secondary | ICD-10-CM

## 2018-03-09 DIAGNOSIS — T7800XD Anaphylactic reaction due to unspecified food, subsequent encounter: Secondary | ICD-10-CM | POA: Diagnosis not present

## 2018-03-09 DIAGNOSIS — J3089 Other allergic rhinitis: Secondary | ICD-10-CM

## 2018-03-09 DIAGNOSIS — T781XXD Other adverse food reactions, not elsewhere classified, subsequent encounter: Secondary | ICD-10-CM | POA: Diagnosis not present

## 2018-03-09 DIAGNOSIS — L500 Allergic urticaria: Secondary | ICD-10-CM

## 2018-03-09 MED ORDER — FLUTICASONE PROPIONATE 50 MCG/ACT NA SUSP: 2.0000 | Freq: Every day | NASAL | 5 refills | Status: DC

## 2018-03-09 MED ORDER — AZELASTINE HCL 0.1 % NA SOLN: 2.0000 | Freq: Two times a day (BID) | NASAL | 5 refills | Status: DC

## 2018-03-09 NOTE — Assessment & Plan Note (Signed)
   Continue allergen avoidance measures and olopatadine eyedrops as needed.

## 2018-03-09 NOTE — Assessment & Plan Note (Addendum)
   Strict avoidance of cow's milk (and all dairy products), peanuts, tree nuts, fish, and shellfish and have access to epinephrine autoinjector 2 pack in case of accidental ingestion.  Food allergy action plan is in place.

## 2018-03-09 NOTE — Progress Notes (Signed)
Follow-up Note  RE: Anna Brooks MRN: [id number] DOB: May 31, 1965 Date of Office Visit: 03/09/2018  Primary care provider: Nolene Ebbs, MD Referring provider: Nolene Ebbs, MD  History of present illness: Anna Brooks is a 52 y.o. female with allergic rhinoconjunctivitis, oral allergy syndrome, and food allergy presenting today for follow-up.  She was previously seen in this clinic for her initial evaluation on December 09, 2017.  Her nasal allergy symptoms are currently well controlled with prescribed allergy medications as needed.  She had started aero allergen immunotherapy injections, however discontinued because she was unable/unwilling to make a trip into the office once a week for the injections.  She avoids cows milk and has access to epinephrine autoinjectors, however states that on 3 different occasions she recently consumed cheese, ranch dressing, and coffee creamer.  During each of these occasions she developed generalized pruritus and urticaria.  She did not experience concomitant angioedema, cardiopulmonary symptoms, or GI symptoms.  Assessment and plan: Allergic rhinitis  Continue appropriate Aeroallergen avoidance measures, levocetirizine as needed, and Dymista nasal spray if needed.  Nasal saline spray (i.e., Simply Saline) or nasal saline lavage (i.e., NeilMed) is recommended as needed and prior to medicated nasal sprays.  If allergen avoidance measures and medications fail to adequately relieve symptoms, consider restarting aeroallergen immunotherapy.  Allergic conjunctivitis  Continue allergen avoidance measures and olopatadine eyedrops as needed.  Food allergy  Strict avoidance of cow's milk (and all dairy products), peanuts, tree nuts, fish, and shellfish and have access to epinephrine autoinjector 2 pack in case of accidental ingestion.  Food allergy action plan is in place.  Oral allergy syndrome  All foods causing symptoms are to be avoided.  Should symptoms  progress beyond the mouth and throat, epinephrine is to be administered and 911 is to be called immediately.   Meds ordered this encounter  Medications  . azelastine (ASTELIN) 0.1 % nasal spray    Sig: Place 2 sprays into both nostrils 2 (two) times daily.    Dispense:  30 mL    Refill:  5  . fluticasone (FLONASE) 50 MCG/ACT nasal spray    Sig: Place 2 sprays into both nostrils daily.    Dispense:  16 g    Refill:  5    Physical examination: Blood pressure 114/80, pulse 80, resp. rate 16.  General: Alert, interactive, in no acute distress. HEENT: TMs pearly gray, turbinates mildly edematous without discharge, post-pharynx mildly erythematous. Neck: Supple without lymphadenopathy. Lungs: Clear to auscultation without wheezing, rhonchi or rales. CV: Normal S1, S2 without murmurs. Skin: Warm and dry, without lesions or rashes.  The following portions of the patient's history were reviewed and updated as appropriate: allergies, current medications, past family history, past medical history, past social history, past surgical history and problem list.  Allergies as of 03/09/2018      Reactions   Codeine Itching   Tetracyclines & Related    Cheese Itching, Rash   Chocolate Itching, Rash   Penicillin G Rash   Sulfa Antibiotics Rash      Medication List       Accurate as of March 09, 2018  5:21 PM. Always use your most recent med list.        azelastine 0.1 % nasal spray Commonly known as:  ASTELIN Place 2 sprays into both nostrils 2 (two) times daily.   cetirizine 10 MG tablet Commonly known as:  ZYRTEC Take 1 tablet (10 mg total) by mouth daily.   EPINEPHrine 0.3 mg/0.3  mL Soaj injection Commonly known as:  AUVI-Q Use as directed for severe allergic reaction   fluticasone 50 MCG/ACT nasal spray Commonly known as:  FLONASE Place 2 sprays into both nostrils daily.   levocetirizine 5 MG tablet Commonly known as:  XYZAL Take 1 tablet (5 mg total) by mouth every  evening.   Olopatadine HCl 0.2 % Soln Commonly known as:  PATADAY Place 1 drop into both eyes daily as needed.   omeprazole 20 MG capsule Commonly known as:  PRILOSEC Take 20 mg by mouth daily.   triamcinolone cream 0.5 % Commonly known as:  KENALOG triamcinolone acetonide 0.5 % topical cream  APPLY TWICE A DAY AS NEEDED       Allergies  Allergen Reactions  . Codeine Itching  . Tetracyclines & Related   . Cheese Itching and Rash  . Chocolate Itching and Rash  . Penicillin G Rash  . Sulfa Antibiotics Rash   Review of systems: Review of systems negative except as noted in HPI / PMHx or noted below: Constitutional: Negative.  HENT: Negative.   Eyes: Negative.  Respiratory: Negative.   Cardiovascular: Negative.  Gastrointestinal: Negative.  Genitourinary: Negative.  Musculoskeletal: Negative.  Neurological: Negative.  Endo/Heme/Allergies: Negative.  Cutaneous: Negative.  Past Medical History:  Diagnosis Date  . Allergy   . GERD (gastroesophageal reflux disease)     Family History  Problem Relation Age of Onset  . Breast cancer Neg Hx   . Colon cancer Neg Hx     Social History   Socioeconomic History  . Marital status: Married    Spouse name: Not on file  . Number of children: Not on file  . Years of education: Not on file  . Highest education level: Not on file  Occupational History  . Occupation: Forensic psychologist: [employer]  . Financial resource strain: Not on file  . Food insecurity:    Worry: Not on file    Inability: Not on file  . Transportation needs:    Medical: Not on file    Non-medical: Not on file  Tobacco Use  . Smoking status: Never Smoker  . Smokeless tobacco: Never Used  Substance and Sexual Activity  . Alcohol use: No  . Drug use: No  . Sexual activity: Yes  Lifestyle  . Physical activity:    Days per week: Not on file    Minutes per session: Not on file  . Stress: Not on file  Relationships  . Social  connections:    Talks on phone: Not on file    Gets together: Not on file    Attends religious service: Not on file    Active member of club or organization: Not on file    Attends meetings of clubs or organizations: Not on file    Relationship status: Not on file  . Intimate partner violence:    Fear of current or ex partner: Not on file    Emotionally abused: Not on file    Physically abused: Not on file    Forced sexual activity: Not on file  Other Topics Concern  . Not on file  Social History Narrative   From [place] - Came to [place] in 1999    I appreciate the opportunity to take part in Jamilett's care. Please do not hesitate to contact me with questions.  Sincerely,   R. Edgar Frisk, MD

## 2018-03-09 NOTE — Assessment & Plan Note (Signed)
   All foods causing symptoms are to be avoided.  Should symptoms progress beyond the mouth and throat, epinephrine is to be administered and 911 is to be called immediately.

## 2018-03-09 NOTE — Assessment & Plan Note (Signed)
   Continue appropriate Aeroallergen avoidance measures, levocetirizine as needed, and Dymista nasal spray if needed.  Nasal saline spray (i.e., Simply Saline) or nasal saline lavage (i.e., NeilMed) is recommended as needed and prior to medicated nasal sprays.  If allergen avoidance measures and medications fail to adequately relieve symptoms, consider restarting aeroallergen immunotherapy.

## 2018-03-09 NOTE — Patient Instructions (Addendum)
Allergic rhinitis  Continue appropriate Aeroallergen avoidance measures, levocetirizine as needed, and Dymista nasal spray if needed.  Nasal saline spray (i.e., Simply Saline) or nasal saline lavage (i.e., NeilMed) is recommended as needed and prior to medicated nasal sprays.  If allergen avoidance measures and medications fail to adequately relieve symptoms, consider restarting aeroallergen immunotherapy.  Allergic conjunctivitis  Continue allergen avoidance measures and olopatadine eyedrops as needed.  Food allergy  Strict avoidance of cow's milk (and all dairy products), peanuts, tree nuts, fish, and shellfish and have access to epinephrine autoinjector 2 pack in case of accidental ingestion.  Food allergy action plan is in place.  Oral allergy syndrome  All foods causing symptoms are to be avoided.  Should symptoms progress beyond the mouth and throat, epinephrine is to be administered and 911 is to be called immediately.   Return in about 1 year (around 03/10/2019), or if symptoms worsen or fail to improve.

## 2018-04-23 DIAGNOSIS — S335XXA Sprain of ligaments of lumbar spine, initial encounter: Secondary | ICD-10-CM | POA: Diagnosis not present

## 2018-04-23 DIAGNOSIS — J302 Other seasonal allergic rhinitis: Secondary | ICD-10-CM | POA: Diagnosis not present

## 2018-10-07 DIAGNOSIS — Z13220 Encounter for screening for lipoid disorders: Secondary | ICD-10-CM | POA: Diagnosis not present

## 2018-10-07 DIAGNOSIS — Z1239 Encounter for other screening for malignant neoplasm of breast: Secondary | ICD-10-CM | POA: Diagnosis not present

## 2018-10-07 DIAGNOSIS — Z0000 Encounter for general adult medical examination without abnormal findings: Secondary | ICD-10-CM | POA: Diagnosis not present

## 2018-10-07 DIAGNOSIS — J302 Other seasonal allergic rhinitis: Secondary | ICD-10-CM | POA: Diagnosis not present

## 2018-10-07 DIAGNOSIS — K219 Gastro-esophageal reflux disease without esophagitis: Secondary | ICD-10-CM | POA: Diagnosis not present

## 2018-10-07 DIAGNOSIS — E559 Vitamin D deficiency, unspecified: Secondary | ICD-10-CM | POA: Diagnosis not present

## 2018-10-07 DIAGNOSIS — Z131 Encounter for screening for diabetes mellitus: Secondary | ICD-10-CM | POA: Diagnosis not present

## 2018-10-07 DIAGNOSIS — L259 Unspecified contact dermatitis, unspecified cause: Secondary | ICD-10-CM | POA: Diagnosis not present

## 2018-10-13 ENCOUNTER — Other Ambulatory Visit: Payer: Self-pay | Admitting: Internal Medicine

## 2018-10-13 DIAGNOSIS — Z1231 Encounter for screening mammogram for malignant neoplasm of breast: Secondary | ICD-10-CM

## 2018-11-27 ENCOUNTER — Ambulatory Visit: Payer: BLUE CROSS/BLUE SHIELD

## 2018-12-11 ENCOUNTER — Other Ambulatory Visit: Payer: Self-pay | Admitting: Internal Medicine

## 2018-12-11 DIAGNOSIS — E2839 Other primary ovarian failure: Secondary | ICD-10-CM

## 2019-02-22 ENCOUNTER — Other Ambulatory Visit: Payer: Self-pay

## 2019-02-22 ENCOUNTER — Ambulatory Visit
Admission: RE | Admit: 2019-02-22 | Discharge: 2019-02-22 | Disposition: A | Discharge status: Home / Self Care | Payer: 59 | Source: Ambulatory Visit | Attending: Internal Medicine | Admitting: Internal Medicine

## 2019-02-22 ENCOUNTER — Ambulatory Visit
Admission: RE | Admit: 2019-02-22 | Discharge: 2019-02-22 | Disposition: A | Discharge status: Home / Self Care | Payer: Self-pay | Source: Ambulatory Visit | Attending: Internal Medicine | Admitting: Internal Medicine

## 2019-02-22 DIAGNOSIS — Z780 Asymptomatic menopausal state: Secondary | ICD-10-CM | POA: Diagnosis not present

## 2019-02-22 DIAGNOSIS — Z1231 Encounter for screening mammogram for malignant neoplasm of breast: Secondary | ICD-10-CM

## 2019-02-22 DIAGNOSIS — E2839 Other primary ovarian failure: Secondary | ICD-10-CM

## 2019-02-22 DIAGNOSIS — M8588 Other specified disorders of bone density and structure, other site: Secondary | ICD-10-CM | POA: Diagnosis not present

## 2019-06-09 DIAGNOSIS — M9903 Segmental and somatic dysfunction of lumbar region: Secondary | ICD-10-CM | POA: Diagnosis not present

## 2019-06-09 DIAGNOSIS — M62830 Muscle spasm of back: Secondary | ICD-10-CM | POA: Diagnosis not present

## 2019-06-09 DIAGNOSIS — M9902 Segmental and somatic dysfunction of thoracic region: Secondary | ICD-10-CM | POA: Diagnosis not present

## 2019-06-09 DIAGNOSIS — M9901 Segmental and somatic dysfunction of cervical region: Secondary | ICD-10-CM | POA: Diagnosis not present

## 2019-06-09 DIAGNOSIS — M9905 Segmental and somatic dysfunction of pelvic region: Secondary | ICD-10-CM | POA: Diagnosis not present

## 2019-06-11 DIAGNOSIS — M9903 Segmental and somatic dysfunction of lumbar region: Secondary | ICD-10-CM | POA: Diagnosis not present

## 2019-06-11 DIAGNOSIS — M62830 Muscle spasm of back: Secondary | ICD-10-CM | POA: Diagnosis not present

## 2019-06-11 DIAGNOSIS — M9905 Segmental and somatic dysfunction of pelvic region: Secondary | ICD-10-CM | POA: Diagnosis not present

## 2019-06-11 DIAGNOSIS — M9902 Segmental and somatic dysfunction of thoracic region: Secondary | ICD-10-CM | POA: Diagnosis not present

## 2019-06-11 DIAGNOSIS — M9901 Segmental and somatic dysfunction of cervical region: Secondary | ICD-10-CM | POA: Diagnosis not present

## 2019-06-14 DIAGNOSIS — M62830 Muscle spasm of back: Secondary | ICD-10-CM | POA: Diagnosis not present

## 2019-06-14 DIAGNOSIS — M9903 Segmental and somatic dysfunction of lumbar region: Secondary | ICD-10-CM | POA: Diagnosis not present

## 2019-06-14 DIAGNOSIS — M9902 Segmental and somatic dysfunction of thoracic region: Secondary | ICD-10-CM | POA: Diagnosis not present

## 2019-06-14 DIAGNOSIS — M9901 Segmental and somatic dysfunction of cervical region: Secondary | ICD-10-CM | POA: Diagnosis not present

## 2019-06-14 DIAGNOSIS — M9905 Segmental and somatic dysfunction of pelvic region: Secondary | ICD-10-CM | POA: Diagnosis not present

## 2019-06-17 DIAGNOSIS — M9901 Segmental and somatic dysfunction of cervical region: Secondary | ICD-10-CM | POA: Diagnosis not present

## 2019-06-17 DIAGNOSIS — M62830 Muscle spasm of back: Secondary | ICD-10-CM | POA: Diagnosis not present

## 2019-06-17 DIAGNOSIS — M9905 Segmental and somatic dysfunction of pelvic region: Secondary | ICD-10-CM | POA: Diagnosis not present

## 2019-06-17 DIAGNOSIS — M9903 Segmental and somatic dysfunction of lumbar region: Secondary | ICD-10-CM | POA: Diagnosis not present

## 2019-06-17 DIAGNOSIS — M9902 Segmental and somatic dysfunction of thoracic region: Secondary | ICD-10-CM | POA: Diagnosis not present

## 2019-06-21 DIAGNOSIS — M9901 Segmental and somatic dysfunction of cervical region: Secondary | ICD-10-CM | POA: Diagnosis not present

## 2019-06-21 DIAGNOSIS — M62830 Muscle spasm of back: Secondary | ICD-10-CM | POA: Diagnosis not present

## 2019-06-21 DIAGNOSIS — M9902 Segmental and somatic dysfunction of thoracic region: Secondary | ICD-10-CM | POA: Diagnosis not present

## 2019-06-21 DIAGNOSIS — M9903 Segmental and somatic dysfunction of lumbar region: Secondary | ICD-10-CM | POA: Diagnosis not present

## 2019-06-21 DIAGNOSIS — M9905 Segmental and somatic dysfunction of pelvic region: Secondary | ICD-10-CM | POA: Diagnosis not present

## 2019-06-28 DIAGNOSIS — M9903 Segmental and somatic dysfunction of lumbar region: Secondary | ICD-10-CM | POA: Diagnosis not present

## 2019-06-28 DIAGNOSIS — M62830 Muscle spasm of back: Secondary | ICD-10-CM | POA: Diagnosis not present

## 2019-06-28 DIAGNOSIS — M9905 Segmental and somatic dysfunction of pelvic region: Secondary | ICD-10-CM | POA: Diagnosis not present

## 2019-06-28 DIAGNOSIS — M9901 Segmental and somatic dysfunction of cervical region: Secondary | ICD-10-CM | POA: Diagnosis not present

## 2019-06-28 DIAGNOSIS — M9902 Segmental and somatic dysfunction of thoracic region: Secondary | ICD-10-CM | POA: Diagnosis not present

## 2019-07-01 DIAGNOSIS — M9901 Segmental and somatic dysfunction of cervical region: Secondary | ICD-10-CM | POA: Diagnosis not present

## 2019-07-01 DIAGNOSIS — M9905 Segmental and somatic dysfunction of pelvic region: Secondary | ICD-10-CM | POA: Diagnosis not present

## 2019-07-01 DIAGNOSIS — M9903 Segmental and somatic dysfunction of lumbar region: Secondary | ICD-10-CM | POA: Diagnosis not present

## 2019-07-01 DIAGNOSIS — M62830 Muscle spasm of back: Secondary | ICD-10-CM | POA: Diagnosis not present

## 2019-07-01 DIAGNOSIS — M9902 Segmental and somatic dysfunction of thoracic region: Secondary | ICD-10-CM | POA: Diagnosis not present

## 2019-07-05 DIAGNOSIS — M9903 Segmental and somatic dysfunction of lumbar region: Secondary | ICD-10-CM | POA: Diagnosis not present

## 2019-07-05 DIAGNOSIS — M9902 Segmental and somatic dysfunction of thoracic region: Secondary | ICD-10-CM | POA: Diagnosis not present

## 2019-07-05 DIAGNOSIS — M9901 Segmental and somatic dysfunction of cervical region: Secondary | ICD-10-CM | POA: Diagnosis not present

## 2019-07-05 DIAGNOSIS — M62830 Muscle spasm of back: Secondary | ICD-10-CM | POA: Diagnosis not present

## 2019-07-05 DIAGNOSIS — M9905 Segmental and somatic dysfunction of pelvic region: Secondary | ICD-10-CM | POA: Diagnosis not present

## 2019-07-08 DIAGNOSIS — M62830 Muscle spasm of back: Secondary | ICD-10-CM | POA: Diagnosis not present

## 2019-07-08 DIAGNOSIS — M9905 Segmental and somatic dysfunction of pelvic region: Secondary | ICD-10-CM | POA: Diagnosis not present

## 2019-07-08 DIAGNOSIS — M9901 Segmental and somatic dysfunction of cervical region: Secondary | ICD-10-CM | POA: Diagnosis not present

## 2019-07-08 DIAGNOSIS — M9902 Segmental and somatic dysfunction of thoracic region: Secondary | ICD-10-CM | POA: Diagnosis not present

## 2019-07-08 DIAGNOSIS — M9903 Segmental and somatic dysfunction of lumbar region: Secondary | ICD-10-CM | POA: Diagnosis not present

## 2019-09-30 DIAGNOSIS — Z131 Encounter for screening for diabetes mellitus: Secondary | ICD-10-CM | POA: Diagnosis not present

## 2019-09-30 DIAGNOSIS — Z13220 Encounter for screening for lipoid disorders: Secondary | ICD-10-CM | POA: Diagnosis not present

## 2019-09-30 DIAGNOSIS — J302 Other seasonal allergic rhinitis: Secondary | ICD-10-CM | POA: Diagnosis not present

## 2019-09-30 DIAGNOSIS — L259 Unspecified contact dermatitis, unspecified cause: Secondary | ICD-10-CM | POA: Diagnosis not present

## 2019-09-30 DIAGNOSIS — Z0000 Encounter for general adult medical examination without abnormal findings: Secondary | ICD-10-CM | POA: Diagnosis not present

## 2019-10-15 DIAGNOSIS — R7611 Nonspecific reaction to tuberculin skin test without active tuberculosis: Secondary | ICD-10-CM | POA: Diagnosis not present

## 2019-10-15 DIAGNOSIS — Z131 Encounter for screening for diabetes mellitus: Secondary | ICD-10-CM | POA: Diagnosis not present

## 2019-10-15 DIAGNOSIS — J302 Other seasonal allergic rhinitis: Secondary | ICD-10-CM | POA: Diagnosis not present

## 2019-10-15 DIAGNOSIS — E559 Vitamin D deficiency, unspecified: Secondary | ICD-10-CM | POA: Diagnosis not present

## 2019-10-15 DIAGNOSIS — Z13220 Encounter for screening for lipoid disorders: Secondary | ICD-10-CM | POA: Diagnosis not present

## 2019-10-15 DIAGNOSIS — Z0000 Encounter for general adult medical examination without abnormal findings: Secondary | ICD-10-CM | POA: Diagnosis not present

## 2020-01-11 DIAGNOSIS — M542 Cervicalgia: Secondary | ICD-10-CM | POA: Diagnosis not present

## 2020-01-11 DIAGNOSIS — S335XXA Sprain of ligaments of lumbar spine, initial encounter: Secondary | ICD-10-CM | POA: Diagnosis not present

## 2020-01-24 DIAGNOSIS — S6990XA Unspecified injury of unspecified wrist, hand and finger(s), initial encounter: Secondary | ICD-10-CM | POA: Diagnosis not present

## 2020-01-24 DIAGNOSIS — M79641 Pain in right hand: Secondary | ICD-10-CM | POA: Diagnosis not present

## 2020-01-25 ENCOUNTER — Encounter (HOSPITAL_COMMUNITY): Payer: Self-pay | Admitting: Orthopedic Surgery

## 2020-01-25 DIAGNOSIS — S62306A Unspecified fracture of fifth metacarpal bone, right hand, initial encounter for closed fracture: Secondary | ICD-10-CM | POA: Diagnosis not present

## 2020-01-25 NOTE — Progress Notes (Signed)
Spoke with pt for pre-op call. Pt denies cardiac history, HTN or Diabetes  Will need Covid test on arrival DOS.

## 2020-01-26 ENCOUNTER — Encounter (HOSPITAL_COMMUNITY): Payer: Self-pay | Admitting: Orthopedic Surgery

## 2020-01-26 ENCOUNTER — Other Ambulatory Visit: Payer: Self-pay

## 2020-01-26 ENCOUNTER — Ambulatory Visit (HOSPITAL_COMMUNITY): Payer: 59 | Admitting: Certified Registered Nurse Anesthetist

## 2020-01-26 ENCOUNTER — Ambulatory Visit (HOSPITAL_COMMUNITY): Payer: 59

## 2020-01-26 ENCOUNTER — Encounter (HOSPITAL_COMMUNITY)
Admission: RE | Disposition: A | Discharge status: Home / Self Care | Payer: Self-pay | Source: Home / Self Care | Attending: Orthopedic Surgery

## 2020-01-26 ENCOUNTER — Ambulatory Visit (HOSPITAL_COMMUNITY)
Admission: RE | Admit: 2020-01-26 | Discharge: 2020-01-26 | Disposition: A | Discharge status: Home / Self Care | Payer: 59 | Attending: Orthopedic Surgery | Admitting: Orthopedic Surgery

## 2020-01-26 DIAGNOSIS — S62306A Unspecified fracture of fifth metacarpal bone, right hand, initial encounter for closed fracture: Secondary | ICD-10-CM | POA: Diagnosis not present

## 2020-01-26 DIAGNOSIS — Z20822 Contact with and (suspected) exposure to covid-19: Secondary | ICD-10-CM | POA: Insufficient documentation

## 2020-01-26 DIAGNOSIS — Z881 Allergy status to other antibiotic agents status: Secondary | ICD-10-CM | POA: Insufficient documentation

## 2020-01-26 DIAGNOSIS — Z880 Allergy status to penicillin: Secondary | ICD-10-CM | POA: Insufficient documentation

## 2020-01-26 DIAGNOSIS — S63051A Subluxation of other carpometacarpal joint of right hand, initial encounter: Secondary | ICD-10-CM | POA: Insufficient documentation

## 2020-01-26 DIAGNOSIS — Z885 Allergy status to narcotic agent status: Secondary | ICD-10-CM | POA: Insufficient documentation

## 2020-01-26 DIAGNOSIS — S63044A Dislocation of carpometacarpal joint of right thumb, initial encounter: Secondary | ICD-10-CM | POA: Diagnosis not present

## 2020-01-26 DIAGNOSIS — K219 Gastro-esophageal reflux disease without esophagitis: Secondary | ICD-10-CM | POA: Diagnosis not present

## 2020-01-26 DIAGNOSIS — S62316A Displaced fracture of base of fifth metacarpal bone, right hand, initial encounter for closed fracture: Secondary | ICD-10-CM | POA: Insufficient documentation

## 2020-01-26 DIAGNOSIS — W101XXA Fall (on)(from) sidewalk curb, initial encounter: Secondary | ICD-10-CM | POA: Insufficient documentation

## 2020-01-26 DIAGNOSIS — Z882 Allergy status to sulfonamides status: Secondary | ICD-10-CM | POA: Diagnosis not present

## 2020-01-26 HISTORY — PX: CLOSED REDUCTION FINGER WITH PERCUTANEOUS PINNING: SHX5612

## 2020-01-26 LAB — CBC
HCT: 42.5 % (ref 36.0–46.0)
Hemoglobin: 13.4 g/dL (ref 12.0–15.0)
MCH: 28.3 pg (ref 26.0–34.0)
MCHC: 31.5 g/dL (ref 30.0–36.0)
MCV: 89.9 fL (ref 80.0–100.0)
Platelets: 246 10*3/uL (ref 150–400)
RBC: 4.73 MIL/uL (ref 3.87–5.11)
RDW: 13.2 % (ref 11.5–15.5)
WBC: 4.6 10*3/uL (ref 4.0–10.5)
nRBC: 0 % (ref 0.0–0.2)

## 2020-01-26 LAB — SARS CORONAVIRUS 2 BY RT PCR (HOSPITAL ORDER, PERFORMED IN ~~LOC~~ HOSPITAL LAB): SARS Coronavirus 2: NEGATIVE

## 2020-01-26 SURGERY — CLOSED REDUCTION, FINGER, WITH PERCUTANEOUS PINNING
Anesthesia: Regional | Site: Finger | Laterality: Right

## 2020-01-26 MED ORDER — OXYCODONE HCL 5 MG/5ML PO SOLN: 5.0000 mg | Freq: Once | ORAL | Status: DC | PRN

## 2020-01-26 MED ORDER — DEXAMETHASONE SODIUM PHOSPHATE 10 MG/ML IJ SOLN
INTRAMUSCULAR | Status: DC | PRN
  Administered 2020-01-26: 8 mg via INTRAVENOUS

## 2020-01-26 MED ORDER — CLINDAMYCIN PHOSPHATE 900 MG/50ML IV SOLN
INTRAVENOUS | Status: AC
  Filled 2020-01-26: qty 50

## 2020-01-26 MED ORDER — ACETAMINOPHEN 10 MG/ML IV SOLN
INTRAVENOUS | Status: AC
  Filled 2020-01-26: qty 100

## 2020-01-26 MED ORDER — HYDROCODONE-ACETAMINOPHEN 5-325 MG PO TABS: 1.0000 | ORAL_TABLET | ORAL | 0 refills | Status: AC | PRN

## 2020-01-26 MED ORDER — LIDOCAINE 2% (20 MG/ML) 5 ML SYRINGE
INTRAMUSCULAR | Status: DC | PRN
  Administered 2020-01-26: 60 mg via INTRAVENOUS

## 2020-01-26 MED ORDER — HYDROMORPHONE HCL 1 MG/ML IJ SOLN
0.2500 mg | INTRAMUSCULAR | Status: DC | PRN
  Administered 2020-01-26 (×2): 0.5 mg via INTRAVENOUS

## 2020-01-26 MED ORDER — MIDAZOLAM HCL 2 MG/2ML IJ SOLN
INTRAMUSCULAR | Status: AC
  Filled 2020-01-26: qty 2

## 2020-01-26 MED ORDER — OXYCODONE HCL 5 MG/5ML PO SOLN: 5.0000 mg | Freq: Once | ORAL | Status: AC | PRN

## 2020-01-26 MED ORDER — BUPIVACAINE HCL 0.25 % IJ SOLN
INTRAMUSCULAR | Status: DC | PRN
  Administered 2020-01-26: 7 mL

## 2020-01-26 MED ORDER — FENTANYL CITRATE (PF) 100 MCG/2ML IJ SOLN
INTRAMUSCULAR | Status: AC
  Filled 2020-01-26: qty 2

## 2020-01-26 MED ORDER — OXYCODONE HCL 5 MG PO TABS
ORAL_TABLET | ORAL | Status: AC
  Filled 2020-01-26: qty 1

## 2020-01-26 MED ORDER — PROMETHAZINE HCL 25 MG/ML IJ SOLN: 6.2500 mg | INTRAMUSCULAR | Status: DC | PRN

## 2020-01-26 MED ORDER — LIDOCAINE 2% (20 MG/ML) 5 ML SYRINGE
INTRAMUSCULAR | Status: AC
  Filled 2020-01-26: qty 5

## 2020-01-26 MED ORDER — LACTATED RINGERS IV SOLN: INTRAVENOUS | Status: DC

## 2020-01-26 MED ORDER — ACETAMINOPHEN 10 MG/ML IV SOLN
1000.0000 mg | Freq: Once | INTRAVENOUS | Status: AC
  Administered 2020-01-26: 1000 mg via INTRAVENOUS

## 2020-01-26 MED ORDER — CLINDAMYCIN PHOSPHATE 900 MG/50ML IV SOLN
900.0000 mg | INTRAVENOUS | Status: AC
  Administered 2020-01-26: 900 mg via INTRAVENOUS

## 2020-01-26 MED ORDER — HYDROMORPHONE HCL 1 MG/ML IJ SOLN
INTRAMUSCULAR | Status: AC
  Filled 2020-01-26: qty 1

## 2020-01-26 MED ORDER — PROPOFOL 10 MG/ML IV BOLUS
INTRAVENOUS | Status: DC | PRN
  Administered 2020-01-26: 200 mg via INTRAVENOUS

## 2020-01-26 MED ORDER — OXYCODONE HCL 5 MG PO TABS: 5.0000 mg | ORAL_TABLET | Freq: Once | ORAL | Status: DC | PRN

## 2020-01-26 MED ORDER — FENTANYL CITRATE (PF) 250 MCG/5ML IJ SOLN
INTRAMUSCULAR | Status: DC | PRN
  Administered 2020-01-26: 50 ug via INTRAVENOUS

## 2020-01-26 MED ORDER — MIDAZOLAM HCL 2 MG/2ML IJ SOLN
INTRAMUSCULAR | Status: DC | PRN
  Administered 2020-01-26: 2 mg via INTRAVENOUS

## 2020-01-26 MED ORDER — OXYCODONE HCL 5 MG PO TABS
5.0000 mg | ORAL_TABLET | Freq: Once | ORAL | Status: AC | PRN
  Administered 2020-01-26: 5 mg via ORAL

## 2020-01-26 MED ORDER — HYDROMORPHONE HCL 1 MG/ML IJ SOLN: 0.2500 mg | INTRAMUSCULAR | Status: DC | PRN

## 2020-01-26 MED ORDER — CHLORHEXIDINE GLUCONATE 0.12 % MT SOLN
OROMUCOSAL | Status: AC
  Administered 2020-01-26: 15 mL
  Filled 2020-01-26: qty 15

## 2020-01-26 MED ORDER — ONDANSETRON HCL 4 MG/2ML IJ SOLN
INTRAMUSCULAR | Status: DC | PRN
  Administered 2020-01-26: 4 mg via INTRAVENOUS

## 2020-01-26 MED ORDER — SODIUM CHLORIDE 0.9 % IR SOLN
Status: DC | PRN
  Administered 2020-01-26: 1000 mL

## 2020-01-26 MED ORDER — KETOROLAC TROMETHAMINE 30 MG/ML IJ SOLN: 30.0000 mg | Freq: Once | INTRAMUSCULAR | Status: DC | PRN

## 2020-01-26 MED ORDER — ONDANSETRON HCL 4 MG/2ML IJ SOLN
INTRAMUSCULAR | Status: AC
  Filled 2020-01-26: qty 4

## 2020-01-26 MED ORDER — DEXAMETHASONE SODIUM PHOSPHATE 10 MG/ML IJ SOLN
INTRAMUSCULAR | Status: AC
  Filled 2020-01-26: qty 2

## 2020-01-26 MED ORDER — FENTANYL CITRATE (PF) 250 MCG/5ML IJ SOLN
INTRAMUSCULAR | Status: AC
  Filled 2020-01-26: qty 5

## 2020-01-26 SURGICAL SUPPLY — 31 items
APL SKNCLS STERI-STRIP NONHPOA (GAUZE/BANDAGES/DRESSINGS)
BENZOIN TINCTURE PRP APPL 2/3 (GAUZE/BANDAGES/DRESSINGS) IMPLANT
BLADE CLIPPER SURG (BLADE) IMPLANT
BNDG ELASTIC 3X5.8 VLCR STR LF (GAUZE/BANDAGES/DRESSINGS) ×2 IMPLANT
BNDG ELASTIC 4X5.8 VLCR STR LF (GAUZE/BANDAGES/DRESSINGS) IMPLANT
BNDG GAUZE ELAST 4 BULKY (GAUZE/BANDAGES/DRESSINGS) ×2 IMPLANT
COVER SURGICAL LIGHT HANDLE (MISCELLANEOUS) ×2 IMPLANT
DRAPE OEC MINIVIEW 54X84 (DRAPES) ×2 IMPLANT
DRSG EMULSION OIL 3X3 NADH (GAUZE/BANDAGES/DRESSINGS) IMPLANT
GAUZE SPONGE 4X4 12PLY STRL (GAUZE/BANDAGES/DRESSINGS) IMPLANT
GAUZE XEROFORM 1X8 LF (GAUZE/BANDAGES/DRESSINGS) IMPLANT
GLOVE BIOGEL PI IND STRL 8.5 (GLOVE) ×1 IMPLANT
GLOVE BIOGEL PI INDICATOR 8.5 (GLOVE) ×1
GLOVE SURG ORTHO 8.0 STRL STRW (GLOVE) ×2 IMPLANT
GOWN STRL REUS W/ TWL LRG LVL3 (GOWN DISPOSABLE) ×2 IMPLANT
GOWN STRL REUS W/TWL LRG LVL3 (GOWN DISPOSABLE) ×4
KIT BASIN OR (CUSTOM PROCEDURE TRAY) ×2 IMPLANT
KIT TURNOVER KIT B (KITS) ×2 IMPLANT
NS IRRIG 1000ML POUR BTL (IV SOLUTION) ×2 IMPLANT
PACK ORTHO EXTREMITY (CUSTOM PROCEDURE TRAY) ×2 IMPLANT
PAD ARMBOARD 7.5X6 YLW CONV (MISCELLANEOUS) ×4 IMPLANT
SOAP 2 % CHG 4 OZ (WOUND CARE) ×2 IMPLANT
STRIP CLOSURE SKIN 1/2X4 (GAUZE/BANDAGES/DRESSINGS) IMPLANT
SUCTION FRAZIER HANDLE 10FR (MISCELLANEOUS)
SUCTION TUBE FRAZIER 10FR DISP (MISCELLANEOUS) IMPLANT
SUT PROLENE 3 0 PS 2 (SUTURE) IMPLANT
SUT PROLENE 4 0 P 3 18 (SUTURE) IMPLANT
SUT VICRYL 4-0 PS2 18IN ABS (SUTURE) IMPLANT
TOWEL GREEN STERILE (TOWEL DISPOSABLE) ×2 IMPLANT
TOWEL GREEN STERILE FF (TOWEL DISPOSABLE) ×2 IMPLANT
WATER STERILE IRR 1000ML POUR (IV SOLUTION) ×2 IMPLANT

## 2020-01-26 NOTE — Op Note (Signed)
PREOPERATIVE DIAGNOSIS: Right small finger metacarpal base fracture involving the CMC joint, CMC subluxation  POSTOPERATIVE DIAGNOSIS: Same  ATTENDING SURGEON: Dr. Iran Planas who scrubbed and present for the entire procedure  ASSISTANT SURGEON: Gertie Fey, PA-C was scrubbed and necessary for close reduction pinning splinting in a timely fashion  ANESTHESIA: General via LMA  OPERATIVE PROCEDURE: Closed reduction and percutaneous skeletal fixation of an unstable right fifth CMC dislocation Radiographs 3 views right hand  IMPLANTS: Two 0.045 K wires  RADIOGRAPHIC INTERPRETATION: AP lateral oblique views of the hand do show the cross K wire fixation in place of the right hand.  There is good alignment of the fifth CMC joint  SURGICAL INDICATIONS: Patient is a right-hand dominant female who sustained a closed injury to the right hand.  Patient was seen evaluate the office and recommended undergo the above procedure.  Risks of surgery include but not limited to bleeding infection damage nearby nerves arteries or tendons loss of motion of the wrist and digits incomplete relief of symptoms and need for further surgical invention.  SURGICAL TECHNIQUE: Patient was palpated via the preoperative holding area marked apart a marker made on the right hand indicate correct operative site.  Patient brought back operating placed supine on anesthesia table where the general anesthetic was administered.  Patient tolerated this well.  Preoperative antibiotics were given prior to skin incision.  A well-padded tourniquet placed on the right brachium and seal with the appropriate drape.  The right upper extremities then prepped and draped normal sterile fashion.  A timeout was called the correct site identified procedure then begun.  Close manipulation was successful to the small finger metacarpal base.  This reduced the [facility] joint.  Following this the transverse K wire was placed from the small finger metacarpal  into the ring finger metacarpal holding the metacarpal out the length.  Following this a additional K wire was placed across the metacarpal into the [facility] joint with good purchase.  The K wires then cut and then bent left out of the skin.  Xeroform bolster dressing was then applied.  Final radiographs were then obtained.  The patient is then placed in a well-padded ulnar gutter splint extubated taken recovery room in good condition.  POSTOPERATIVE PLAN: Patient be discharged to home.  See him back in the office in 2 weeks for wound check pin check down to see our therapist for an ulnar gutter splint.  See her back at the 4-week mark to take the K wires and then begin a more formal therapy regimen working on the mobility.  Radiographs at each visit.

## 2020-01-26 NOTE — Anesthesia Preprocedure Evaluation (Addendum)
Anesthesia Evaluation  Patient identified by MRN, date of birth, ID band Patient awake    Reviewed: Allergy & Precautions, NPO status , Patient's Chart, lab work & pertinent test results  Airway Mallampati: II  TM Distance: >3 FB Neck ROM: Full    Dental no notable dental hx.    Pulmonary neg pulmonary ROS,    Pulmonary exam normal breath sounds clear to auscultation       Cardiovascular negative cardio ROS Normal cardiovascular exam Rhythm:Regular Rate:Normal     Neuro/Psych negative neurological ROS  negative psych ROS   GI/Hepatic Neg liver ROS, GERD  ,  Endo/Other  negative endocrine ROS  Renal/GU negative Renal ROS  negative genitourinary   Musculoskeletal negative musculoskeletal ROS (+)   Abdominal   Peds negative pediatric ROS (+)  Hematology negative hematology ROS (+)   Anesthesia Other Findings   Reproductive/Obstetrics negative OB ROS                             Anesthesia Physical Anesthesia Plan  ASA: II  Anesthesia Plan: General   Post-op Pain Management:    Induction: Intravenous  PONV Risk Score and Plan: 3 and Ondansetron, Midazolam, Treatment may vary due to age or medical condition and Dexamethasone  Airway Management Planned: LMA  Additional Equipment:   Intra-op Plan:   Post-operative Plan: Extubation in OR  Informed Consent: I have reviewed the patients History and Physical, chart, labs and discussed the procedure including the risks, benefits and alternatives for the proposed anesthesia with the patient or authorized representative who has indicated his/her understanding and acceptance.     Dental advisory given  Plan Discussed with: CRNA  Anesthesia Plan Comments:        Anesthesia Quick Evaluation

## 2020-01-26 NOTE — Discharge Instructions (Signed)
KEEP BANDAGE CLEAN AND DRY CALL OFFICE FOR F/U APPT [phone] in 13 days rx sent to [facility] KEEP HAND ELEVATED ABOVE HEART OK TO APPLY ICE TO OPERATIVE AREA CONTACT OFFICE IF ANY WORSENING PAIN OR CONCERNS.

## 2020-01-26 NOTE — Anesthesia Procedure Notes (Signed)
Procedure Name: LMA Insertion Date/Time: 01/26/2020 6:00 PM Performed by: Barrington Ellison, CRNA Pre-anesthesia Checklist: Patient identified, Emergency Drugs available, Suction available and Patient being monitored Patient Re-evaluated:Patient Re-evaluated prior to induction Oxygen Delivery Method: Circle System Utilized Preoxygenation: Pre-oxygenation with 100% oxygen Induction Type: IV induction Ventilation: Mask ventilation without difficulty LMA: LMA inserted LMA Size: 4.0 Number of attempts: 1 Placement Confirmation: positive ETCO2 Tube secured with: Tape Dental Injury: Teeth and Oropharynx as per pre-operative assessment

## 2020-01-26 NOTE — H&P (Signed)
  Anna Brooks is an 54 y.o. female.   Chief Complaint: RIGHT HAND PAIN  HPI: The patient is a 54y/o right hand dominant female who fell while stepping off a curb on 01/22/20 and catching herself with the right hand. She had immediate swelling, tingling, pain, and weakness. She was seen at urgent care where she was put into a splint.  She was seen in our office for further treatment. Discussed the need for surgical intervention.  She is here today for surgery.  She denies chest pain, shortness of breath, fever, chills, nausea, vomiting, or diarrhea.   Past Medical History:  Diagnosis Date  . Allergy   . GERD (gastroesophageal reflux disease)     Past Surgical History:  Procedure Laterality Date  . APPENDECTOMY    . CARPAL TUNNEL RELEASE    . DENTAL RESTORATION/EXTRACTION WITH X-RAY    . TUBAL LIGATION  2001    Family History  Problem Relation Age of Onset  . Breast cancer Neg Hx   . Colon cancer Neg Hx    Social History:  reports that she has never smoked. She has never used smokeless tobacco. She reports that she does not drink alcohol and does not use drugs.  Allergies:  Allergies  Allergen Reactions  . Codeine Itching  . Cheese Itching and Rash  . Chocolate Itching and Rash  . Penicillin G Rash  . Sulfa Antibiotics Rash  . Tetracyclines & Related Rash    No medications prior to admission.    No results found for this or any previous visit (from the past 48 hour(s)). No results found.  ROS  NO RECENT ILLNESSES OR HOSPITALIZATIONS  There were no vitals taken for this visit. Physical Exam  General Appearance:  Alert, cooperative, no distress, appears stated age  Head:  Normocephalic, without obvious abnormality, atraumatic  Eyes:  Pupils equal, conjunctiva/corneas clear,         Throat: Lips, mucosa, and tongue normal; teeth and gums normal  Neck: No visible masses     Lungs:   respirations unlabored  Chest Wall:  No tenderness or deformity  Heart:  Regular  rate and rhythm,  Abdomen:   Soft, non-tender,         Extremities: RUE: skin intact fingers warm well perfused good cap refil Good motion to index/long/ring fingers  Pulses: 2+ and symmetric  Skin: Skin color, texture, turgor normal, no rashes or lesions     Neurologic: Normal     Assessment/Plan RIGHT SMALL FINGER METACARPAL BASE FRACTURE    - RIGHT SMALL FINGER CLOSED REDUCTION AND PERCUTANEOUS PINNING, POSSIBLE OPEN REDUCTION  R/B/A DISCUSSED WITH PT IN OFFICE.  PT VOICED UNDERSTANDING OF PLAN CONSENT SIGNED DAY OF SURGERY PT SEEN AND EXAMINED PRIOR TO OPERATIVE PROCEDURE/DAY OF SURGERY SITE MARKED. QUESTIONS ANSWERED WILL GO HOME FOLLOWING SURGERY   WE ARE PLANNING SURGERY FOR YOUR UPPER EXTREMITY. THE RISKS AND BENEFITS OF SURGERY INCLUDE BUT NOT LIMITED TO BLEEDING INFECTION, DAMAGE TO NEARBY NERVES ARTERIES TENDONS, FAILURE OF SURGERY TO ACCOMPLISH ITS INTENDED GOALS, PERSISTENT SYMPTOMS AND NEED FOR FURTHER SURGICAL INTERVENTION. WITH THIS IN MIND WE WILL PROCEED. I HAVE DISCUSSED WITH THE PATIENT THE PRE AND POSTOPERATIVE REGIMEN AND THE DOS AND DON'TS. PT VOICED UNDERSTANDING AND INFORMED CONSENT SIGNED.  Iran Planas MD 01/26/20  Brynda Peon 01/26/2020, 9:28 AM

## 2020-01-26 NOTE — Transfer of Care (Signed)
Immediate Anesthesia Transfer of Care Note  Patient: Anna Brooks  Procedure(s) Performed: CLOSED REDUCTION FINGER WITH PERCUTANEOUS PINNING VS OPRN REDUCTION AND INTERAL FIXATION (Right Finger)  Patient Location: PACU  Anesthesia Type:General  Level of Consciousness: drowsy  Airway & Oxygen Therapy: Patient Spontanous Breathing and Patient connected to nasal cannula oxygen  Post-op Assessment: Report given to RN  Post vital signs: Reviewed and stable  Last Vitals:  Vitals Value Taken Time  BP    Temp    Pulse 80 01/26/20 1833  Resp 13 01/26/20 1833  SpO2 99 % 01/26/20 1833  Vitals shown include unvalidated device data.  Last Pain:  Vitals:   01/26/20 1505  TempSrc: Oral      Patients Stated Pain Goal: 3 (34/62/19 4712)  Complications: No complications documented.

## 2020-01-27 ENCOUNTER — Encounter (HOSPITAL_COMMUNITY): Payer: Self-pay | Admitting: Orthopedic Surgery

## 2020-01-27 NOTE — Anesthesia Postprocedure Evaluation (Signed)
Anesthesia Post Note  Patient: Anna Brooks  Procedure(s) Performed: CLOSED REDUCTION FINGER WITH PERCUTANEOUS PINNING VS OPRN REDUCTION AND INTERAL FIXATION (Right Finger)     Patient location during evaluation: PACU Anesthesia Type: Regional Level of consciousness: awake and alert Pain management: pain level controlled Vital Signs Assessment: post-procedure vital signs reviewed and stable Respiratory status: spontaneous breathing, nonlabored ventilation, respiratory function stable and patient connected to nasal cannula oxygen Cardiovascular status: blood pressure returned to baseline and stable Postop Assessment: no apparent nausea or vomiting Anesthetic complications: no   No complications documented.  Last Vitals:  Vitals:   01/26/20 1934 01/26/20 1948  BP: 125/78 129/78  Pulse: 73 62  Resp: 15 13  Temp: (!) 36.3 C   SpO2: 96% 93%    Last Pain:  Vitals:   01/26/20 1934  TempSrc:   PainSc: [place]

## 2020-02-03 DIAGNOSIS — S62316D Displaced fracture of base of fifth metacarpal bone, right hand, subsequent encounter for fracture with routine healing: Secondary | ICD-10-CM | POA: Diagnosis not present

## 2020-02-03 DIAGNOSIS — M25641 Stiffness of right hand, not elsewhere classified: Secondary | ICD-10-CM | POA: Diagnosis not present

## 2020-02-03 DIAGNOSIS — J302 Other seasonal allergic rhinitis: Secondary | ICD-10-CM | POA: Diagnosis not present

## 2020-02-03 DIAGNOSIS — S62329D Displaced fracture of shaft of unspecified metacarpal bone, subsequent encounter for fracture with routine healing: Secondary | ICD-10-CM | POA: Diagnosis not present

## 2020-02-03 DIAGNOSIS — S335XXD Sprain of ligaments of lumbar spine, subsequent encounter: Secondary | ICD-10-CM | POA: Diagnosis not present

## 2020-02-25 DIAGNOSIS — S62316A Displaced fracture of base of fifth metacarpal bone, right hand, initial encounter for closed fracture: Secondary | ICD-10-CM | POA: Diagnosis not present

## 2020-02-25 DIAGNOSIS — M25641 Stiffness of right hand, not elsewhere classified: Secondary | ICD-10-CM | POA: Diagnosis not present

## 2020-02-25 DIAGNOSIS — S62316D Displaced fracture of base of fifth metacarpal bone, right hand, subsequent encounter for fracture with routine healing: Secondary | ICD-10-CM | POA: Diagnosis not present

## 2020-02-25 DIAGNOSIS — S62306A Unspecified fracture of fifth metacarpal bone, right hand, initial encounter for closed fracture: Secondary | ICD-10-CM | POA: Diagnosis not present

## 2020-03-03 DIAGNOSIS — M25641 Stiffness of right hand, not elsewhere classified: Secondary | ICD-10-CM | POA: Diagnosis not present

## 2020-03-09 DIAGNOSIS — M25641 Stiffness of right hand, not elsewhere classified: Secondary | ICD-10-CM | POA: Diagnosis not present

## 2020-03-16 DIAGNOSIS — M25641 Stiffness of right hand, not elsewhere classified: Secondary | ICD-10-CM | POA: Diagnosis not present

## 2020-03-24 DIAGNOSIS — S62316D Displaced fracture of base of fifth metacarpal bone, right hand, subsequent encounter for fracture with routine healing: Secondary | ICD-10-CM | POA: Diagnosis not present

## 2020-03-24 DIAGNOSIS — M25641 Stiffness of right hand, not elsewhere classified: Secondary | ICD-10-CM | POA: Diagnosis not present

## 2020-03-31 ENCOUNTER — Other Ambulatory Visit: Payer: Self-pay | Admitting: Internal Medicine

## 2020-03-31 DIAGNOSIS — Z1231 Encounter for screening mammogram for malignant neoplasm of breast: Secondary | ICD-10-CM

## 2020-04-05 ENCOUNTER — Ambulatory Visit: Payer: 59

## 2020-04-25 DIAGNOSIS — S62316D Displaced fracture of base of fifth metacarpal bone, right hand, subsequent encounter for fracture with routine healing: Secondary | ICD-10-CM | POA: Diagnosis not present

## 2020-05-25 DIAGNOSIS — S62316D Displaced fracture of base of fifth metacarpal bone, right hand, subsequent encounter for fracture with routine healing: Secondary | ICD-10-CM | POA: Diagnosis not present

## 2020-07-06 DIAGNOSIS — S62316D Displaced fracture of base of fifth metacarpal bone, right hand, subsequent encounter for fracture with routine healing: Secondary | ICD-10-CM | POA: Diagnosis not present

## 2020-07-11 ENCOUNTER — Other Ambulatory Visit: Payer: Self-pay | Admitting: Internal Medicine

## 2020-07-11 DIAGNOSIS — Z1239 Encounter for other screening for malignant neoplasm of breast: Secondary | ICD-10-CM | POA: Diagnosis not present

## 2020-07-11 DIAGNOSIS — Z13220 Encounter for screening for lipoid disorders: Secondary | ICD-10-CM | POA: Diagnosis not present

## 2020-07-11 DIAGNOSIS — L259 Unspecified contact dermatitis, unspecified cause: Secondary | ICD-10-CM | POA: Diagnosis not present

## 2020-07-11 DIAGNOSIS — J302 Other seasonal allergic rhinitis: Secondary | ICD-10-CM | POA: Diagnosis not present

## 2020-07-11 DIAGNOSIS — Z0000 Encounter for general adult medical examination without abnormal findings: Secondary | ICD-10-CM | POA: Diagnosis not present

## 2020-07-11 DIAGNOSIS — Z131 Encounter for screening for diabetes mellitus: Secondary | ICD-10-CM | POA: Diagnosis not present

## 2020-07-11 DIAGNOSIS — E559 Vitamin D deficiency, unspecified: Secondary | ICD-10-CM | POA: Diagnosis not present

## 2020-07-11 DIAGNOSIS — K219 Gastro-esophageal reflux disease without esophagitis: Secondary | ICD-10-CM | POA: Diagnosis not present

## 2020-07-12 LAB — CBC
HCT: 39.9 % (ref 35.0–45.0)
Hemoglobin: 13.3 g/dL (ref 11.7–15.5)
MCH: 28.7 pg (ref 27.0–33.0)
MCHC: 33.3 g/dL (ref 32.0–36.0)
MCV: 86.2 fL (ref 80.0–100.0)
MPV: 11.3 fL (ref 7.5–12.5)
Platelets: 271 10*3/uL (ref 140–400)
RBC: 4.63 10*6/uL (ref 3.80–5.10)
RDW: 13 % (ref 11.0–15.0)
WBC: 4.7 10*3/uL (ref 3.8–10.8)

## 2020-07-12 LAB — COMPLETE METABOLIC PANEL WITH GFR
AG Ratio: 1.3 (calc) (ref 1.0–2.5)
ALT: 10 U/L (ref 6–29)
AST: 17 U/L (ref 10–35)
Albumin: 4.1 g/dL (ref 3.6–5.1)
Alkaline phosphatase (APISO): 84 U/L (ref 37–153)
BUN: 11 mg/dL (ref 7–25)
CO2: 28 mmol/L (ref 20–32)
Calcium: 9.4 mg/dL (ref 8.6–10.4)
Chloride: 104 mmol/L (ref 98–110)
Creat: 0.65 mg/dL (ref 0.50–1.05)
GFR, Est African American: 114 mL/min/{1.73_m2} (ref >=60)
GFR, Est Non African American: 99 mL/min/{1.73_m2} (ref >=60)
Globulin: 3.2 g/dL (calc) (ref 1.9–3.7)
Glucose, Bld: 104 mg/dL — ABNORMAL HIGH (ref 65–99)
Potassium: 4.1 mmol/L (ref 3.5–5.3)
Sodium: 140 mmol/L (ref 135–146)
Total Bilirubin: 1 mg/dL (ref 0.2–1.2)
Total Protein: 7.3 g/dL (ref 6.1–8.1)

## 2020-07-12 LAB — LIPID PANEL
Cholesterol: 166 mg/dL (ref <200)
HDL: 52 mg/dL (ref >=50)
LDL Cholesterol (Calc): 95 mg/dL (calc)
Non-HDL Cholesterol (Calc): 114 mg/dL (calc) (ref <130)
Total CHOL/HDL Ratio: 3.2 (calc) (ref <5.0)
Triglycerides: 97 mg/dL (ref <150)

## 2020-07-12 LAB — VITAMIN D 25 HYDROXY (VIT D DEFICIENCY, FRACTURES): Vit D, 25-Hydroxy: 29 ng/mL — ABNORMAL LOW (ref 30–100)

## 2020-07-25 ENCOUNTER — Ambulatory Visit: Payer: 59

## 2020-08-02 DIAGNOSIS — Z418 Encounter for other procedures for purposes other than remedying health state: Secondary | ICD-10-CM | POA: Diagnosis not present

## 2020-08-02 DIAGNOSIS — K219 Gastro-esophageal reflux disease without esophagitis: Secondary | ICD-10-CM | POA: Diagnosis not present

## 2020-08-02 DIAGNOSIS — J302 Other seasonal allergic rhinitis: Secondary | ICD-10-CM | POA: Diagnosis not present

## 2020-10-11 ENCOUNTER — Ambulatory Visit
Admission: RE | Admit: 2020-10-11 | Discharge: 2020-10-11 | Disposition: A | Discharge status: Home / Self Care | Payer: 59 | Source: Ambulatory Visit | Attending: Internal Medicine | Admitting: Internal Medicine

## 2020-10-11 ENCOUNTER — Other Ambulatory Visit: Payer: Self-pay

## 2020-10-11 DIAGNOSIS — Z1231 Encounter for screening mammogram for malignant neoplasm of breast: Secondary | ICD-10-CM

## 2021-02-28 DIAGNOSIS — Z20822 Contact with and (suspected) exposure to covid-19: Secondary | ICD-10-CM | POA: Diagnosis not present

## 2021-02-28 DIAGNOSIS — Z03818 Encounter for observation for suspected exposure to other biological agents ruled out: Secondary | ICD-10-CM | POA: Diagnosis not present

## 2021-03-29 ENCOUNTER — Other Ambulatory Visit: Payer: Self-pay | Admitting: Internal Medicine

## 2021-03-29 DIAGNOSIS — Z13220 Encounter for screening for lipoid disorders: Secondary | ICD-10-CM | POA: Diagnosis not present

## 2021-03-29 DIAGNOSIS — Z131 Encounter for screening for diabetes mellitus: Secondary | ICD-10-CM | POA: Diagnosis not present

## 2021-03-29 DIAGNOSIS — J302 Other seasonal allergic rhinitis: Secondary | ICD-10-CM | POA: Diagnosis not present

## 2021-03-29 DIAGNOSIS — K219 Gastro-esophageal reflux disease without esophagitis: Secondary | ICD-10-CM | POA: Diagnosis not present

## 2021-03-29 DIAGNOSIS — E559 Vitamin D deficiency, unspecified: Secondary | ICD-10-CM | POA: Diagnosis not present

## 2021-03-29 DIAGNOSIS — N390 Urinary tract infection, site not specified: Secondary | ICD-10-CM | POA: Diagnosis not present

## 2021-03-29 DIAGNOSIS — Z0000 Encounter for general adult medical examination without abnormal findings: Secondary | ICD-10-CM | POA: Diagnosis not present

## 2021-03-29 DIAGNOSIS — M5489 Other dorsalgia: Secondary | ICD-10-CM | POA: Diagnosis not present

## 2021-03-30 LAB — CBC
HCT: 41.8 % (ref 35.0–45.0)
Hemoglobin: 14.2 g/dL (ref 11.7–15.5)
MCH: 28.9 pg (ref 27.0–33.0)
MCHC: 34 g/dL (ref 32.0–36.0)
MCV: 85.1 fL (ref 80.0–100.0)
MPV: 10.8 fL (ref 7.5–12.5)
Platelets: 297 10*3/uL (ref 140–400)
RBC: 4.91 10*6/uL (ref 3.80–5.10)
RDW: 13.2 % (ref 11.0–15.0)
WBC: 5.5 10*3/uL (ref 3.8–10.8)

## 2021-03-30 LAB — URINE CULTURE
MICRO NUMBER:: 12863167
SPECIMEN QUALITY:: ADEQUATE

## 2021-03-30 LAB — COMPLETE METABOLIC PANEL WITH GFR
AG Ratio: 1.2 (calc) (ref 1.0–2.5)
ALT: 19 U/L (ref 6–29)
AST: 20 U/L (ref 10–35)
Albumin: 4.1 g/dL (ref 3.6–5.1)
Alkaline phosphatase (APISO): 94 U/L (ref 37–153)
BUN: 9 mg/dL (ref 7–25)
CO2: 29 mmol/L (ref 20–32)
Calcium: 9.8 mg/dL (ref 8.6–10.4)
Chloride: 103 mmol/L (ref 98–110)
Creat: 0.64 mg/dL (ref 0.50–1.03)
Globulin: 3.5 g/dL (calc) (ref 1.9–3.7)
Glucose, Bld: 87 mg/dL (ref 65–99)
Potassium: 4.3 mmol/L (ref 3.5–5.3)
Sodium: 141 mmol/L (ref 135–146)
Total Bilirubin: 1.1 mg/dL (ref 0.2–1.2)
Total Protein: 7.6 g/dL (ref 6.1–8.1)
eGFR: 102 mL/min/{1.73_m2} (ref >=60)

## 2021-03-30 LAB — LIPID PANEL
Cholesterol: 187 mg/dL (ref <200)
HDL: 52 mg/dL (ref >=50)
LDL Cholesterol (Calc): 113 mg/dL (calc) — ABNORMAL HIGH
Non-HDL Cholesterol (Calc): 135 mg/dL (calc) — ABNORMAL HIGH (ref <130)
Total CHOL/HDL Ratio: 3.6 (calc) (ref <5.0)
Triglycerides: 109 mg/dL (ref <150)

## 2021-03-30 LAB — VITAMIN D 25 HYDROXY (VIT D DEFICIENCY, FRACTURES): Vit D, 25-Hydroxy: 28 ng/mL — ABNORMAL LOW (ref 30–100)

## 2021-03-30 LAB — TSH: TSH: 1.32 mIU/L (ref 0.40–4.50)

## 2021-03-31 DIAGNOSIS — R079 Chest pain, unspecified: Secondary | ICD-10-CM | POA: Diagnosis not present

## 2021-03-31 DIAGNOSIS — M940 Chondrocostal junction syndrome [Tietze]: Secondary | ICD-10-CM | POA: Diagnosis not present

## 2021-04-18 DIAGNOSIS — M25511 Pain in right shoulder: Secondary | ICD-10-CM | POA: Diagnosis not present

## 2021-04-18 DIAGNOSIS — K219 Gastro-esophageal reflux disease without esophagitis: Secondary | ICD-10-CM | POA: Diagnosis not present

## 2021-04-18 DIAGNOSIS — J302 Other seasonal allergic rhinitis: Secondary | ICD-10-CM | POA: Diagnosis not present

## 2021-07-02 DIAGNOSIS — Z135 Encounter for screening for eye and ear disorders: Secondary | ICD-10-CM | POA: Diagnosis not present

## 2021-07-02 DIAGNOSIS — H5203 Hypermetropia, bilateral: Secondary | ICD-10-CM | POA: Diagnosis not present

## 2021-07-02 DIAGNOSIS — H524 Presbyopia: Secondary | ICD-10-CM | POA: Diagnosis not present

## 2021-07-02 DIAGNOSIS — H52223 Regular astigmatism, bilateral: Secondary | ICD-10-CM | POA: Diagnosis not present

## 2021-09-04 ENCOUNTER — Other Ambulatory Visit: Payer: Self-pay

## 2021-09-04 DIAGNOSIS — K219 Gastro-esophageal reflux disease without esophagitis: Secondary | ICD-10-CM | POA: Diagnosis not present

## 2021-09-04 DIAGNOSIS — Z1239 Encounter for other screening for malignant neoplasm of breast: Secondary | ICD-10-CM | POA: Diagnosis not present

## 2021-09-04 DIAGNOSIS — J302 Other seasonal allergic rhinitis: Secondary | ICD-10-CM | POA: Diagnosis not present

## 2021-09-04 DIAGNOSIS — L259 Unspecified contact dermatitis, unspecified cause: Secondary | ICD-10-CM | POA: Diagnosis not present

## 2021-09-04 DIAGNOSIS — Z124 Encounter for screening for malignant neoplasm of cervix: Secondary | ICD-10-CM | POA: Diagnosis not present

## 2021-09-04 MED ORDER — FLUTICASONE PROPIONATE 50 MCG/ACT NA SUSP
NASAL | 5 refills | Status: DC
  Filled 2021-09-04: qty 16, 30d supply, fill #0

## 2021-09-04 MED ORDER — OMEPRAZOLE 20 MG PO CPDR
DELAYED_RELEASE_CAPSULE | ORAL | 2 refills | Status: AC
  Filled 2021-09-04: qty 30, 30d supply, fill #0
  Filled 2022-08-22: qty 30, 30d supply, fill #1

## 2021-09-04 MED ORDER — TRIAMCINOLONE ACETONIDE 0.5 % EX CREA
TOPICAL_CREAM | CUTANEOUS | 2 refills | Status: AC
  Filled 2021-09-04: qty 60, 30d supply, fill #0
  Filled 2021-11-12: qty 120, 30d supply, fill #0

## 2021-09-05 ENCOUNTER — Other Ambulatory Visit: Payer: Self-pay

## 2021-09-11 ENCOUNTER — Other Ambulatory Visit: Payer: Self-pay

## 2021-11-07 ENCOUNTER — Encounter: Payer: Self-pay | Admitting: Obstetrics & Gynecology

## 2021-11-07 ENCOUNTER — Ambulatory Visit (INDEPENDENT_AMBULATORY_CARE_PROVIDER_SITE_OTHER): Payer: 59 | Admitting: Obstetrics & Gynecology

## 2021-11-07 ENCOUNTER — Other Ambulatory Visit (HOSPITAL_COMMUNITY)
Admission: RE | Admit: 2021-11-07 | Discharge: 2021-11-07 | Disposition: A | Discharge status: Home / Self Care | Payer: 59 | Source: Ambulatory Visit | Attending: Obstetrics & Gynecology | Admitting: Obstetrics & Gynecology

## 2021-11-07 VITALS — BP 146/83 | HR 74 | Ht 66.0 in | Wt 165.0 lb

## 2021-11-07 DIAGNOSIS — Z01419 Encounter for gynecological examination (general) (routine) without abnormal findings: Secondary | ICD-10-CM | POA: Insufficient documentation

## 2021-11-07 NOTE — Progress Notes (Signed)
Patient ID: Anna Brooks, female   DOB: [birth year], 56 y.o.   MRN: [id number]  Chief Complaint  Patient presents with   New Patient (Initial Visit)    HPI Anna Brooks is a 57 y.o. female.  [id number] Postmenopausal, with last pap about 5 years ago which was normal. She had breast exam in January and is scheduled for annual mammogram HPI  Past Medical History:  Diagnosis Date   Allergy    GERD (gastroesophageal reflux disease)     Past Surgical History:  Procedure Laterality Date   APPENDECTOMY     CARPAL TUNNEL RELEASE     CLOSED REDUCTION FINGER WITH PERCUTANEOUS PINNING Right 01/26/2020   Procedure: CLOSED REDUCTION FINGER WITH PERCUTANEOUS PINNING VS OPRN REDUCTION AND INTERAL FIXATION;  Surgeon: Iran Planas, MD;  Location: [facility];  Service: Orthopedics;  Laterality: Right;  WITH IV SEDATION   DENTAL RESTORATION/EXTRACTION WITH X-RAY     TUBAL LIGATION  2001    Family History  Problem Relation Age of Onset   Breast cancer Neg Hx    Colon cancer Neg Hx     Social History Social History   Tobacco Use   Smoking status: Never   Smokeless tobacco: Never  Substance Use Topics   Alcohol use: No   Drug use: No    Allergies  Allergen Reactions   Codeine Itching   Cheese Itching and Rash   Chocolate Itching and Rash   Penicillin G Rash   Sulfa Antibiotics Rash   Tetracyclines & Related Rash    Current Outpatient Medications  Medication Sig Dispense Refill   Multiple Vitamin (MULTIVITAMIN WITH MINERALS) TABS tablet Take 1 tablet by mouth every evening.     cetirizine (ZYRTEC) 10 MG tablet Take 1 tablet (10 mg total) by mouth daily. (Patient not taking: Reported on 11/07/2021) 30 tablet 3   EPINEPHrine (AUVI-Q) 0.3 mg/0.3 mL IJ SOAJ injection Use as directed for severe allergic reaction (Patient not taking: Reported on 11/07/2021) 2 Device 2   fluticasone (FLONASE) 50 MCG/ACT nasal spray insert 2 sprays into each nostril daily in the evening (Patient not taking: Reported on  11/07/2021) 16 g 5   omeprazole (PRILOSEC) 20 MG capsule take 1 capsule by mouth every morning 30 capsule 2   triamcinolone cream (KENALOG) 0.5 % apply cream topically twice a day as needed 120 g 2   No current facility-administered medications for this visit.    Review of Systems Review of Systems  Constitutional: Negative.   Respiratory: Negative.    Cardiovascular: Negative.   Gastrointestinal: Negative.   Genitourinary: Negative.   Neurological: Negative.   Psychiatric/Behavioral: Negative.      Blood pressure (!) 146/83, pulse 74, height '5\' 6"'$  (1.676 m), weight 165 lb (74.8 kg).  Physical Exam Physical Exam Vitals and nursing note reviewed. Exam conducted with a chaperone present.  Constitutional:      Appearance: Normal appearance.  Cardiovascular:     Rate and Rhythm: Normal rate.  Pulmonary:     Effort: Pulmonary effort is normal.  Genitourinary:    General: Normal vulva.     Exam position: Lithotomy position.     Vagina: Normal.     Cervix: Normal.     Uterus: Normal.      Adnexa: Right adnexa normal and left adnexa normal.  Skin:    General: Skin is warm and dry.  Neurological:     General: No focal deficit present.     Mental Status: She is alert and oriented  to person, place, and time.     Data Reviewed Office notes  Assessment Well woman exam with routine gynecological exam - Plan: Cytology - PAP( [place] [facility]) Doing well postmenopause   Plan No orders of the defined types were placed in this encounter.  RTC annual  Mammogram as scheduled    Emeterio Reeve 11/07/2021, 4:42 PM

## 2021-11-12 ENCOUNTER — Other Ambulatory Visit: Payer: Self-pay

## 2021-11-13 DIAGNOSIS — K219 Gastro-esophageal reflux disease without esophagitis: Secondary | ICD-10-CM | POA: Diagnosis not present

## 2021-11-13 DIAGNOSIS — R7611 Nonspecific reaction to tuberculin skin test without active tuberculosis: Secondary | ICD-10-CM | POA: Diagnosis not present

## 2021-11-13 DIAGNOSIS — G5603 Carpal tunnel syndrome, bilateral upper limbs: Secondary | ICD-10-CM | POA: Diagnosis not present

## 2021-11-13 DIAGNOSIS — J302 Other seasonal allergic rhinitis: Secondary | ICD-10-CM | POA: Diagnosis not present

## 2021-11-14 LAB — CYTOLOGY - PAP
Comment: NEGATIVE
Diagnosis: UNDETERMINED — AB
High risk HPV: NEGATIVE

## 2022-03-08 IMAGING — MG MM DIGITAL SCREENING BILAT W/ TOMO AND CAD
6 of 10 series · 6 of 30 positions shown · non-contrast
Comparison: Previous exam(s).

CLINICAL DATA: Screening.

EXAM:
DIGITAL SCREENING BILATERAL MAMMOGRAM WITH TOMOSYNTHESIS AND CAD
TECHNIQUE: Bilateral screening digital craniocaudal and mediolateral oblique
mammograms were obtained. Bilateral screening digital breast
tomosynthesis was performed. The images were evaluated with
computer-aided detection.

[L CC synth-2D]
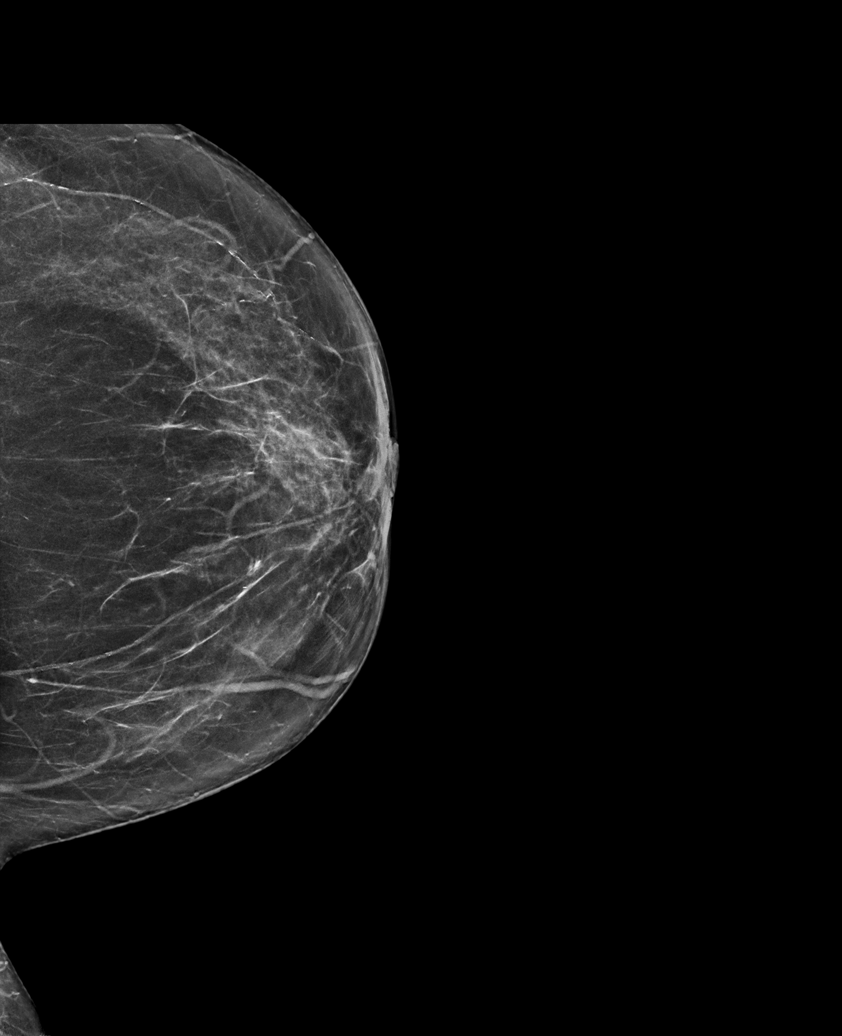

[R CC synth-2D]
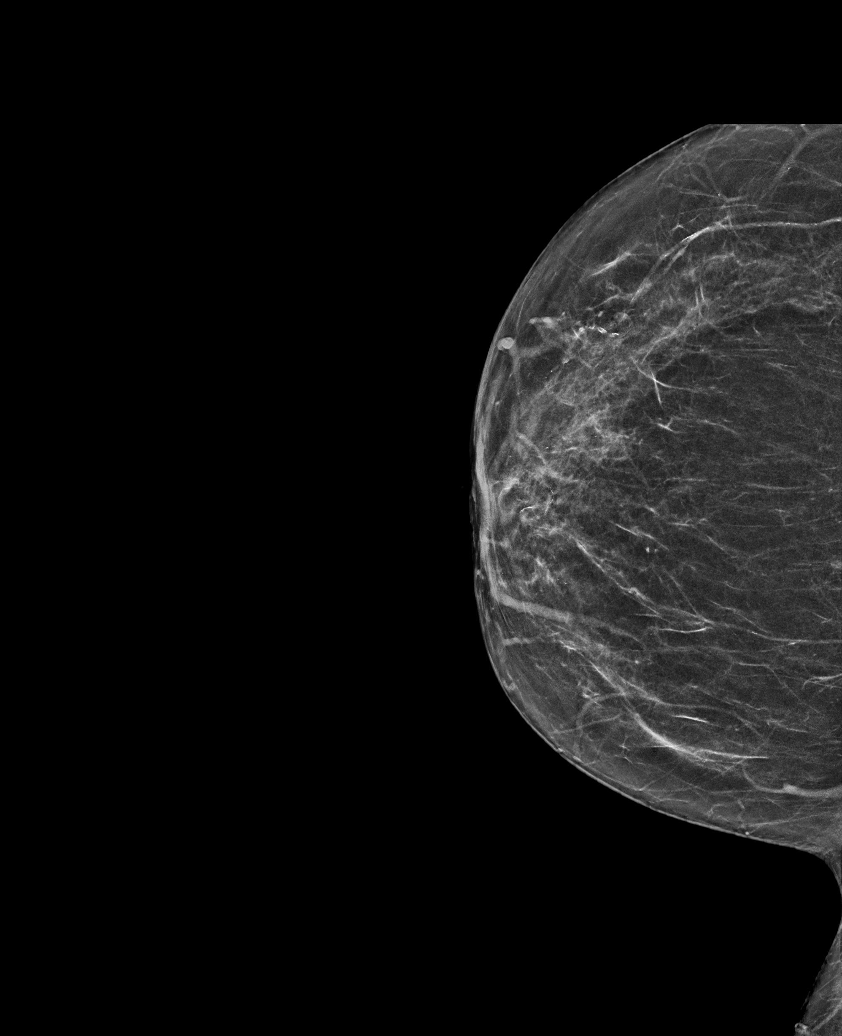

[L MLO synth-2D (1 of 2)]
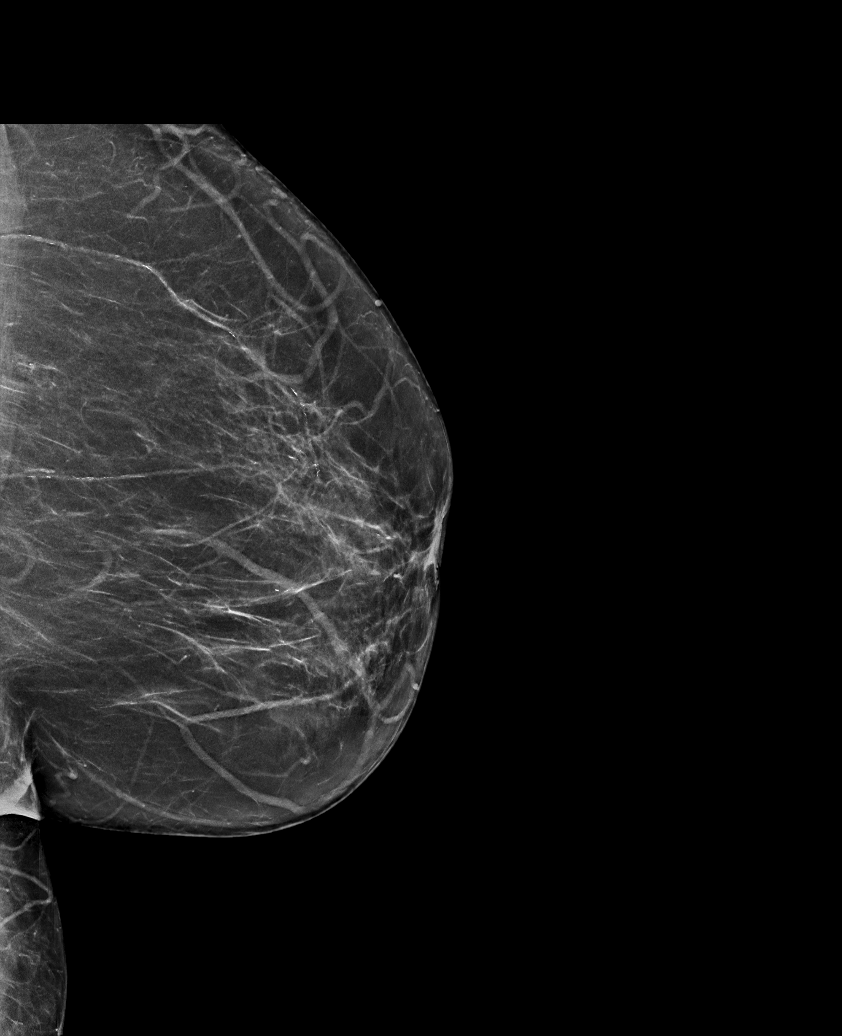

[R MLO synth-2D]
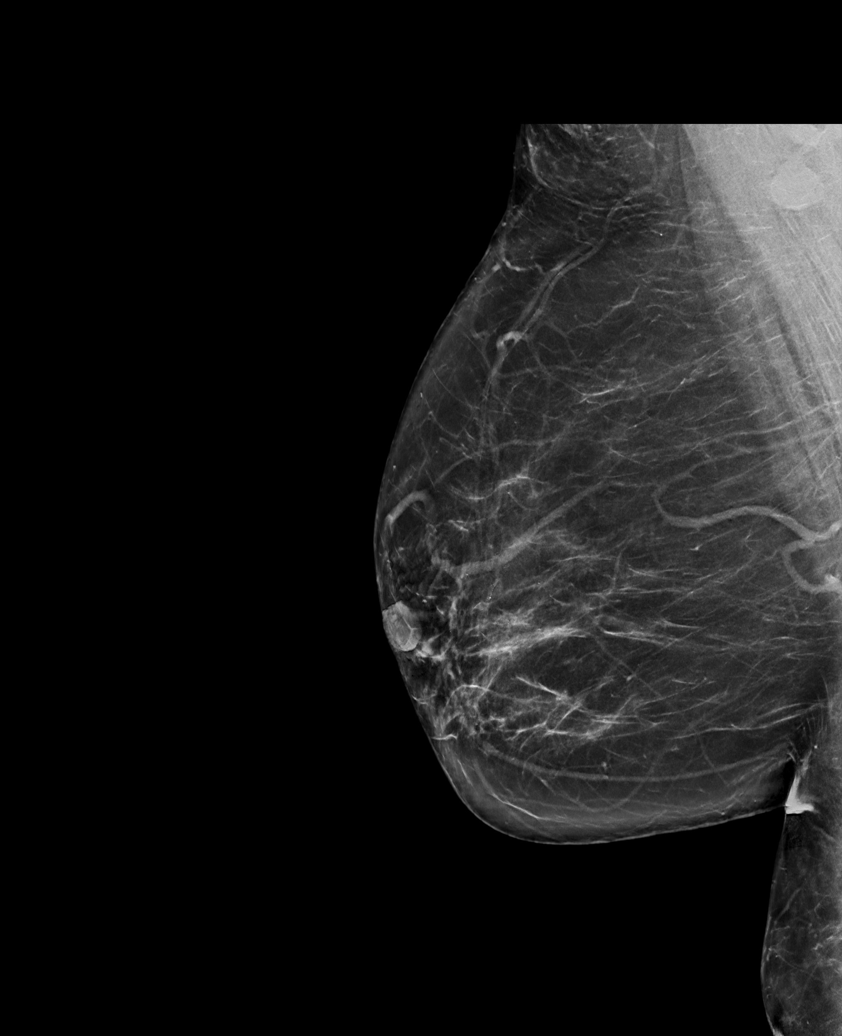

[L MLO synth-2D (2 of 2)]
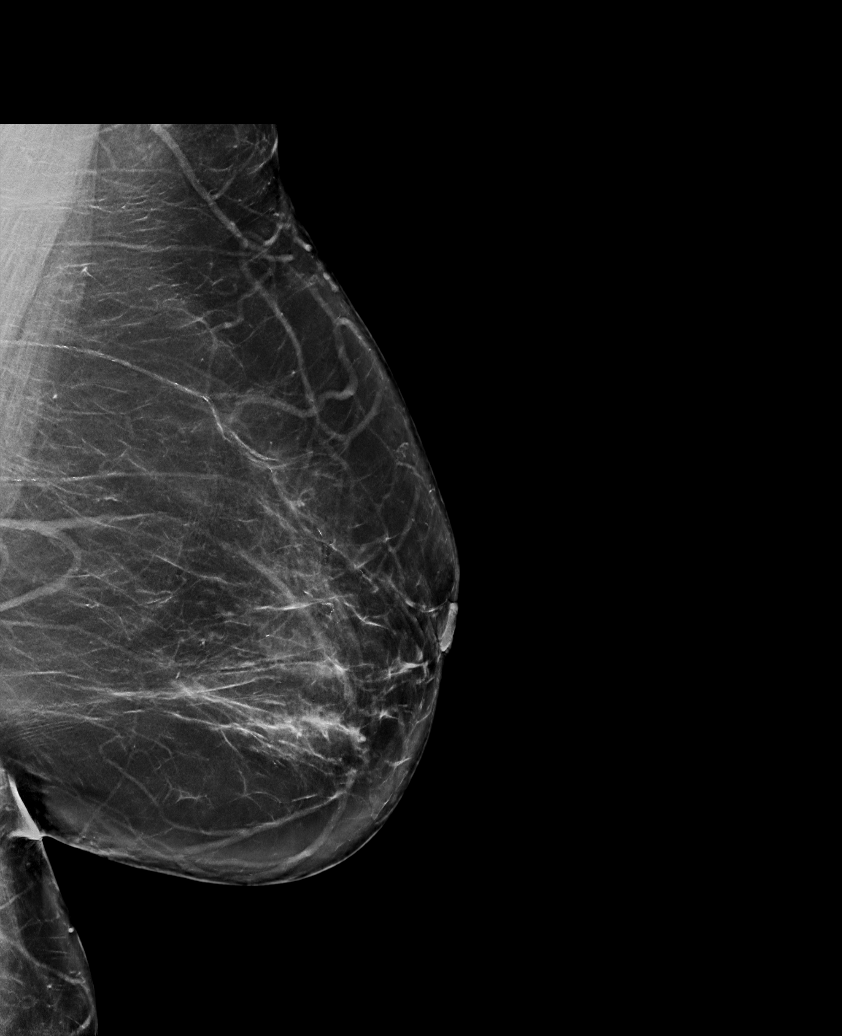

[L MLO tomo · tomo slice 36/71.0]
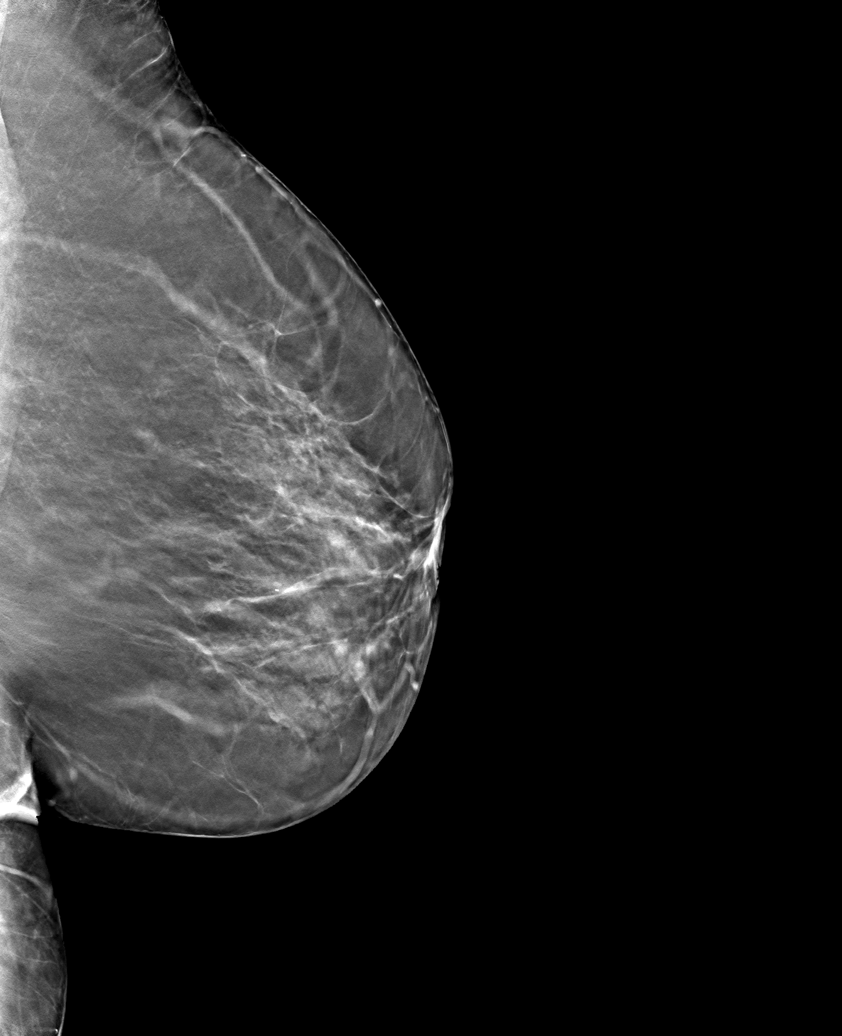

[6 of 30 positions shown; findings below may reference images not displayed]

ACR Breast Density Category b: There are scattered areas of
fibroglandular density.
FINDINGS: There are no findings suspicious for malignancy.
IMPRESSION: No mammographic evidence of malignancy. A result letter of this
screening mammogram will be mailed directly to the patient.

RECOMMENDATION:
Screening mammogram in one year. (Code:51-O-LD2)

BI-RADS CATEGORY  1: Negative.

## 2022-03-19 ENCOUNTER — Other Ambulatory Visit: Payer: Self-pay | Admitting: Internal Medicine

## 2022-03-19 DIAGNOSIS — Z1231 Encounter for screening mammogram for malignant neoplasm of breast: Secondary | ICD-10-CM

## 2022-03-22 ENCOUNTER — Ambulatory Visit: Payer: Commercial Managed Care - PPO

## 2022-03-28 ENCOUNTER — Other Ambulatory Visit: Payer: Self-pay | Admitting: Internal Medicine

## 2022-03-28 DIAGNOSIS — E559 Vitamin D deficiency, unspecified: Secondary | ICD-10-CM | POA: Diagnosis not present

## 2022-03-28 DIAGNOSIS — Z131 Encounter for screening for diabetes mellitus: Secondary | ICD-10-CM | POA: Diagnosis not present

## 2022-03-28 DIAGNOSIS — J302 Other seasonal allergic rhinitis: Secondary | ICD-10-CM | POA: Diagnosis not present

## 2022-03-28 DIAGNOSIS — Z13220 Encounter for screening for lipoid disorders: Secondary | ICD-10-CM | POA: Diagnosis not present

## 2022-03-28 DIAGNOSIS — Z0000 Encounter for general adult medical examination without abnormal findings: Secondary | ICD-10-CM | POA: Diagnosis not present

## 2022-03-28 DIAGNOSIS — K219 Gastro-esophageal reflux disease without esophagitis: Secondary | ICD-10-CM | POA: Diagnosis not present

## 2022-03-28 DIAGNOSIS — Z1239 Encounter for other screening for malignant neoplasm of breast: Secondary | ICD-10-CM | POA: Diagnosis not present

## 2022-03-28 DIAGNOSIS — N390 Urinary tract infection, site not specified: Secondary | ICD-10-CM | POA: Diagnosis not present

## 2022-03-28 DIAGNOSIS — M545 Low back pain, unspecified: Secondary | ICD-10-CM | POA: Diagnosis not present

## 2022-03-29 LAB — COMPLETE METABOLIC PANEL WITH GFR
AG Ratio: 1.2 (calc) (ref 1.0–2.5)
ALT: 11 U/L (ref 6–29)
AST: 18 U/L (ref 10–35)
Albumin: 4.1 g/dL (ref 3.6–5.1)
Alkaline phosphatase (APISO): 101 U/L (ref 37–153)
BUN: 7 mg/dL (ref 7–25)
CO2: 26 mmol/L (ref 20–32)
Calcium: 10 mg/dL (ref 8.6–10.4)
Chloride: 103 mmol/L (ref 98–110)
Creat: 0.67 mg/dL (ref 0.50–1.03)
Globulin: 3.4 g/dL (calc) (ref 1.9–3.7)
Glucose, Bld: 67 mg/dL (ref 65–99)
Potassium: 4 mmol/L (ref 3.5–5.3)
Sodium: 142 mmol/L (ref 135–146)
Total Bilirubin: 0.5 mg/dL (ref 0.2–1.2)
Total Protein: 7.5 g/dL (ref 6.1–8.1)
eGFR: 101 mL/min/{1.73_m2} (ref >=60)

## 2022-03-29 LAB — LIPID PANEL
Cholesterol: 167 mg/dL (ref <200)
HDL: 53 mg/dL (ref >=50)
LDL Cholesterol (Calc): 89 mg/dL (calc)
Non-HDL Cholesterol (Calc): 114 mg/dL (calc) (ref <130)
Total CHOL/HDL Ratio: 3.2 (calc) (ref <5.0)
Triglycerides: 149 mg/dL (ref <150)

## 2022-03-29 LAB — CBC
HCT: 40.2 % (ref 35.0–45.0)
Hemoglobin: 13.6 g/dL (ref 11.7–15.5)
MCH: 28.8 pg (ref 27.0–33.0)
MCHC: 33.8 g/dL (ref 32.0–36.0)
MCV: 85 fL (ref 80.0–100.0)
MPV: 11.5 fL (ref 7.5–12.5)
Platelets: 289 10*3/uL (ref 140–400)
RBC: 4.73 10*6/uL (ref 3.80–5.10)
RDW: 13.1 % (ref 11.0–15.0)
WBC: 5.9 10*3/uL (ref 3.8–10.8)

## 2022-03-29 LAB — URINE CULTURE
MICRO NUMBER:: 14419096
SPECIMEN QUALITY:: ADEQUATE

## 2022-03-29 LAB — VITAMIN D 25 HYDROXY (VIT D DEFICIENCY, FRACTURES): Vit D, 25-Hydroxy: 25 ng/mL — ABNORMAL LOW (ref 30–100)

## 2022-03-29 LAB — CLIENT EDUCATION TRACKING

## 2022-04-11 DIAGNOSIS — R109 Unspecified abdominal pain: Secondary | ICD-10-CM | POA: Diagnosis not present

## 2022-04-16 ENCOUNTER — Ambulatory Visit
Admission: RE | Admit: 2022-04-16 | Discharge: 2022-04-16 | Disposition: A | Discharge status: Home / Self Care | Payer: Commercial Managed Care - PPO | Source: Ambulatory Visit | Attending: Internal Medicine | Admitting: Internal Medicine

## 2022-04-16 DIAGNOSIS — Z1231 Encounter for screening mammogram for malignant neoplasm of breast: Secondary | ICD-10-CM

## 2022-04-16 DIAGNOSIS — K5900 Constipation, unspecified: Secondary | ICD-10-CM | POA: Diagnosis not present

## 2022-04-23 DIAGNOSIS — K5904 Chronic idiopathic constipation: Secondary | ICD-10-CM | POA: Diagnosis not present

## 2022-04-23 DIAGNOSIS — D120 Benign neoplasm of cecum: Secondary | ICD-10-CM | POA: Diagnosis not present

## 2022-06-27 DIAGNOSIS — K219 Gastro-esophageal reflux disease without esophagitis: Secondary | ICD-10-CM | POA: Diagnosis not present

## 2022-06-27 DIAGNOSIS — K5904 Chronic idiopathic constipation: Secondary | ICD-10-CM | POA: Diagnosis not present

## 2022-06-27 DIAGNOSIS — M545 Low back pain, unspecified: Secondary | ICD-10-CM | POA: Diagnosis not present

## 2022-08-22 ENCOUNTER — Other Ambulatory Visit: Payer: Self-pay

## 2023-04-01 DIAGNOSIS — R7611 Nonspecific reaction to tuberculin skin test without active tuberculosis: Secondary | ICD-10-CM | POA: Diagnosis not present

## 2023-04-01 DIAGNOSIS — E559 Vitamin D deficiency, unspecified: Secondary | ICD-10-CM | POA: Diagnosis not present

## 2023-04-01 DIAGNOSIS — Z0000 Encounter for general adult medical examination without abnormal findings: Secondary | ICD-10-CM | POA: Diagnosis not present

## 2023-04-01 DIAGNOSIS — Z13220 Encounter for screening for lipoid disorders: Secondary | ICD-10-CM | POA: Diagnosis not present

## 2023-04-15 ENCOUNTER — Other Ambulatory Visit: Payer: Self-pay | Admitting: Emergency Medicine

## 2023-04-15 ENCOUNTER — Ambulatory Visit
Admission: RE | Admit: 2023-04-15 | Discharge: 2023-04-15 | Disposition: A | Discharge status: Home / Self Care | Payer: No Typology Code available for payment source | Source: Ambulatory Visit | Attending: Infectious Diseases | Admitting: Infectious Diseases

## 2023-04-15 DIAGNOSIS — R7611 Nonspecific reaction to tuberculin skin test without active tuberculosis: Secondary | ICD-10-CM

## 2023-05-13 DIAGNOSIS — Z227 Latent tuberculosis: Secondary | ICD-10-CM

## 2023-05-13 HISTORY — DX: Latent tuberculosis: Z22.7

## 2023-05-15 ENCOUNTER — Encounter (HOSPITAL_COMMUNITY): Payer: Self-pay | Admitting: *Deleted

## 2023-05-15 ENCOUNTER — Ambulatory Visit (INDEPENDENT_AMBULATORY_CARE_PROVIDER_SITE_OTHER): Payer: Commercial Managed Care - PPO

## 2023-05-15 ENCOUNTER — Other Ambulatory Visit (HOSPITAL_COMMUNITY): Payer: Self-pay

## 2023-05-15 ENCOUNTER — Other Ambulatory Visit: Payer: Self-pay

## 2023-05-15 ENCOUNTER — Ambulatory Visit (HOSPITAL_COMMUNITY)
Admission: EM | Admit: 2023-05-15 | Discharge: 2023-05-15 | Disposition: A | Discharge status: Home / Self Care | Payer: Commercial Managed Care - PPO | Attending: Physician Assistant | Admitting: Physician Assistant

## 2023-05-15 DIAGNOSIS — R051 Acute cough: Secondary | ICD-10-CM

## 2023-05-15 DIAGNOSIS — R062 Wheezing: Secondary | ICD-10-CM

## 2023-05-15 DIAGNOSIS — R918 Other nonspecific abnormal finding of lung field: Secondary | ICD-10-CM | POA: Diagnosis not present

## 2023-05-15 DIAGNOSIS — J101 Influenza due to other identified influenza virus with other respiratory manifestations: Secondary | ICD-10-CM

## 2023-05-15 DIAGNOSIS — R059 Cough, unspecified: Secondary | ICD-10-CM | POA: Diagnosis not present

## 2023-05-15 LAB — POC COVID19/FLU A&B COMBO
Covid Antigen, POC: NEGATIVE
Influenza A Antigen, POC: POSITIVE — AB
Influenza B Antigen, POC: NEGATIVE

## 2023-05-15 MED ORDER — ALBUTEROL SULFATE HFA 108 (90 BASE) MCG/ACT IN AERS
INHALATION_SPRAY | RESPIRATORY_TRACT | Status: AC
  Filled 2023-05-15: qty 6.7

## 2023-05-15 MED ORDER — ALBUTEROL SULFATE HFA 108 (90 BASE) MCG/ACT IN AERS
2.0000 | INHALATION_SPRAY | Freq: Once | RESPIRATORY_TRACT | Status: AC
  Administered 2023-05-15: 2 via RESPIRATORY_TRACT

## 2023-05-15 MED ORDER — PREDNISONE 20 MG PO TABS
40.0000 mg | ORAL_TABLET | Freq: Every day | ORAL | 0 refills | Status: AC
  Filled 2023-05-15 (×2): qty 8, 4d supply, fill #0

## 2023-05-15 MED ORDER — OSELTAMIVIR PHOSPHATE 75 MG PO CAPS
75.0000 mg | ORAL_CAPSULE | Freq: Two times a day (BID) | ORAL | 0 refills | Status: AC
  Filled 2023-05-15 (×2): qty 10, 5d supply, fill #0

## 2023-05-15 NOTE — ED Triage Notes (Signed)
 Pt states she started with cough, headache and fever X 2 days. This morning fever was 101.3 at home she took IBU. She states she has been wheezing some, she has been taking mucinex and robitussin.   She states she is on meds for latent TB which she started on 05/13/2023, which she stopped yesterday,

## 2023-05-15 NOTE — Discharge Instructions (Signed)
 You tested positive for influenza.  Start Tamiflu twice daily for 5 days.  This can cause stomach upset and abnormal behaviors/dreams so if this occurs stop the medication to be seen.  You have wheezing in your lungs.  Use the albuterol every 4-6 hours as needed.  Start prednisone 40 mg for 4 days.  Do not take NSAIDs with this medication including aspirin, ibuprofen/Advil, naproxen/Aleve.  Use Tylenol/acetaminophen as needed for breakthrough pain.  Gargle with warm salt water and use Mucinex for symptom relief.  If you are not feeling better within a week or if anything worsens and you have high fever, worsening cough, shortness of breath, chest pain, nausea/vomiting interfering with oral intake you need to be seen immediately.

## 2023-05-15 NOTE — ED Provider Notes (Signed)
 [facility]    CSN: [id number] Arrival date & time: 05/15/23  1120      History   Chief Complaint Chief Complaint  Patient presents with   Cough   Fever   Headache   Wheezing    HPI Anna Brooks is a 58 y.o. female.   Patient presents today with a 2-day history of URI symptoms.  She reports cough, headache, congestion, fever, body aches.  She has had some wheezing over the past several hours prompting evaluation.  She denies any diarrhea, chest pain, weakness, syncope.  She has taken ibuprofen, Mucinex, Robitussin without improvement of symptoms.  She has a history of latent TB but has completed treatment and had a negative chest x-ray 04/15/2023.  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  She is up-to-date on her vaccinations.  She has not had COVID recently.  She denies any history of allergies, asthma, COPD.  She does not smoke.    Past Medical History:  Diagnosis Date   Allergy    GERD (gastroesophageal reflux disease)    TB lung, latent 05/13/2023    Patient Active Problem List   Diagnosis Date Noted   Allergic urticaria 03/09/2018   Allergic rhinitis 12/09/2017   Allergic conjunctivitis 12/09/2017   Food allergy 12/09/2017   Oral allergy syndrome 12/09/2017    Past Surgical History:  Procedure Laterality Date   APPENDECTOMY     CARPAL TUNNEL RELEASE     CLOSED REDUCTION FINGER WITH PERCUTANEOUS PINNING Right 01/26/2020   Procedure: CLOSED REDUCTION FINGER WITH PERCUTANEOUS PINNING VS OPRN REDUCTION AND INTERAL FIXATION;  Surgeon: Bradly Bienenstock, MD;  Location: [facility];  Service: Orthopedics;  Laterality: Right;  WITH IV SEDATION   DENTAL RESTORATION/EXTRACTION WITH X-RAY     TUBAL LIGATION  2001    OB History     Gravida  6   Para  6   Term  6   Preterm      AB      Living  6      SAB      IAB      Ectopic      Multiple      Live Births  6            Home Medications     Prior to Admission medications   Medication Sig Start Date End Date Taking? Authorizing Provider  oseltamivir (TAMIFLU) 75 MG capsule Take 1 capsule (75 mg total) by mouth every 12 (twelve) hours. 05/15/23  Yes Tacara Hadlock K, PA-C  predniSONE (DELTASONE) 20 MG tablet Take 2 tablets (40 mg total) by mouth daily for 4 days. 05/15/23 05/19/23 Yes Senovia Gauer, Noberto Retort, PA-C  EPINEPHrine (AUVI-Q) 0.3 mg/0.3 mL IJ SOAJ injection Use as directed for severe allergic reaction Patient not taking: Reported on 11/07/2021 01/14/18   Bobbitt, Heywood Iles, MD  Multiple Vitamin (MULTIVITAMIN WITH MINERALS) TABS tablet Take 1 tablet by mouth every evening.    [provider]  omeprazole (PRILOSEC) 20 MG capsule take 1 capsule by mouth every morning 09/04/21     triamcinolone cream (KENALOG) 0.5 % apply cream topically twice a day as needed 09/04/21       Family History Family History  Problem Relation Age of Onset   Breast cancer Neg Hx    Colon cancer Neg Hx     Social History Social History   Tobacco Use   Smoking status: Never  Smokeless tobacco: Never  Substance Use Topics   Alcohol use: No   Drug use: No     Allergies   Hydrocodone, Codeine, Ibuprofen, Cheese, Chocolate, Penicillin g, Sulfa antibiotics, and Tetracyclines & related   Review of Systems Review of Systems  Constitutional:  Positive for activity change, fatigue and fever. Negative for appetite change.  HENT:  Positive for congestion. Negative for sinus pressure, sneezing and sore throat.   Respiratory:  Positive for cough and wheezing. Negative for shortness of breath.   Cardiovascular:  Negative for chest pain.  Gastrointestinal:  Negative for abdominal pain, diarrhea, nausea and vomiting.  Musculoskeletal:  Positive for arthralgias and myalgias.  Neurological:  Positive for headaches. Negative for dizziness and light-headedness.     Physical Exam Triage Vital Signs ED Triage Vitals  Encounter Vitals Group     BP  05/15/23 1237 139/84     Systolic BP Percentile --      Diastolic BP Percentile --      Pulse Rate 05/15/23 1237 98     Resp 05/15/23 1237 18     Temp 05/15/23 1237 99.6 F (37.6 C)     Temp Source 05/15/23 1237 Oral     SpO2 05/15/23 1237 95 %     Weight --      Height --      Head Circumference --      Peak Flow --      Pain Score 05/15/23 1234 8     Pain Loc --      Pain Education --      Exclude from Growth Chart --    No data found.  Updated Vital Signs BP 139/84 (BP Location: Right Arm)   Pulse 98   Temp 99.6 F (37.6 C) (Oral)   Resp 18   SpO2 95%   Visual Acuity Right Eye Distance:   Left Eye Distance:   Bilateral Distance:    Right Eye Near:   Left Eye Near:    Bilateral Near:     Physical Exam Vitals reviewed.  Constitutional:      General: She is awake. She is not in acute distress.    Appearance: Normal appearance. She is well-developed. She is not ill-appearing.     Comments: Very pleasant female appears stated age in no acute distress sitting comfortably in exam room  HENT:     Head: Normocephalic and atraumatic.     Right Ear: Tympanic membrane, ear canal and external ear normal. Tympanic membrane is not erythematous or bulging.     Left Ear: Tympanic membrane, ear canal and external ear normal. Tympanic membrane is not erythematous or bulging.     Nose:     Right Sinus: No maxillary sinus tenderness or frontal sinus tenderness.     Left Sinus: No maxillary sinus tenderness or frontal sinus tenderness.     Mouth/Throat:     Pharynx: Uvula midline. No oropharyngeal exudate or posterior oropharyngeal erythema.  Cardiovascular:     Rate and Rhythm: Normal rate and regular rhythm.     Heart sounds: Normal heart sounds, S1 normal and S2 normal. No murmur heard. Pulmonary:     Effort: Pulmonary effort is normal.     Breath sounds: Wheezing present. No rhonchi or rales.  Psychiatric:        Behavior: Behavior is cooperative.      UC Treatments /  Results  Labs (all labs ordered are listed, but only abnormal results are displayed) Labs Reviewed  POC COVID19/FLU A&B COMBO - Abnormal; Notable for the following components:      Result Value   Influenza A Antigen, POC Positive (*)    All other components within normal limits    EKG   Radiology No results found.  Procedures Procedures (including critical care time)  Medications Ordered in UC Medications  albuterol (VENTOLIN HFA) 108 (90 Base) MCG/ACT inhaler 2 puff (2 puffs Inhalation Given 05/15/23 1300)    Initial Impression / Assessment and Plan / UC Course  I have reviewed the triage vital signs and the nursing notes.  Pertinent labs & imaging results that were available during my care of the patient were reviewed by me and considered in my medical decision making (see chart for details).     Patient is well-appearing, afebrile, nontoxic, nontachycardic.  No evidence of acute infection on physical exam though would warrant initiation of antibiotics.  She does a positive for influenza.  She is within 48 hours of symptoms onset so we will start Tamiflu twice daily for 5 days.  She had significant wheezing on exam and was given albuterol with improvement of symptoms.  She was sent home with this medication to be used every 4-6 hours as needed.  Will use prednisone burst of 40 mg for 4 days.  We discussed that she is not to take NSAIDs with this medication.  She denies history of diabetes but was encouraged to avoid carbohydrates and push fluids.  Chest x-ray was obtained that showed no acute cardiopulmonary disease based on my primary read.  At the time discharge we were waiting for radiologist over read and we will contact her if this differs or changes our treatment plan.  She is to return if her symptoms have not improved within a week.  If she has any worsening symptoms she needs to be seen immediately including chest pain, shortness of breath, high fever, worsening cough.   Strict return precautions given.  Excuse note provided.  Final Clinical Impressions(s) / UC Diagnoses   Final diagnoses:  Acute cough  Influenza A  Wheezing     Discharge Instructions      You tested positive for influenza.  Start Tamiflu twice daily for 5 days.  This can cause stomach upset and abnormal behaviors/dreams so if this occurs stop the medication to be seen.  You have wheezing in your lungs.  Use the albuterol every 4-6 hours as needed.  Start prednisone 40 mg for 4 days.  Do not take NSAIDs with this medication including aspirin, ibuprofen/Advil, naproxen/Aleve.  Use Tylenol/acetaminophen as needed for breakthrough pain.  Gargle with warm salt water and use Mucinex for symptom relief.  If you are not feeling better within a week or if anything worsens and you have high fever, worsening cough, shortness of breath, chest pain, nausea/vomiting interfering with oral intake you need to be seen immediately.     ED Prescriptions     Medication Sig Dispense Auth. Provider   predniSONE (DELTASONE) 20 MG tablet Take 2 tablets (40 mg total) by mouth daily for 4 days. 8 tablet Apolonia Ellwood K, PA-C   oseltamivir (TAMIFLU) 75 MG capsule Take 1 capsule (75 mg total) by mouth every 12 (twelve) hours. 10 capsule Shirell Struthers, Noberto Retort, PA-C      PDMP not reviewed this encounter.   Jeani Hawking, PA-C 05/15/23 1340

## 2023-05-26 ENCOUNTER — Encounter (HOSPITAL_COMMUNITY): Payer: Self-pay

## 2023-05-26 ENCOUNTER — Ambulatory Visit (INDEPENDENT_AMBULATORY_CARE_PROVIDER_SITE_OTHER)

## 2023-05-26 ENCOUNTER — Ambulatory Visit (HOSPITAL_COMMUNITY)
Admission: EM | Admit: 2023-05-26 | Discharge: 2023-05-26 | Disposition: A | Discharge status: Home / Self Care | Attending: Internal Medicine | Admitting: Internal Medicine

## 2023-05-26 DIAGNOSIS — R051 Acute cough: Secondary | ICD-10-CM | POA: Diagnosis not present

## 2023-05-26 DIAGNOSIS — R0602 Shortness of breath: Secondary | ICD-10-CM

## 2023-05-26 DIAGNOSIS — J209 Acute bronchitis, unspecified: Secondary | ICD-10-CM

## 2023-05-26 DIAGNOSIS — R059 Cough, unspecified: Secondary | ICD-10-CM | POA: Diagnosis not present

## 2023-05-26 DIAGNOSIS — R918 Other nonspecific abnormal finding of lung field: Secondary | ICD-10-CM | POA: Diagnosis not present

## 2023-05-26 DIAGNOSIS — R7612 Nonspecific reaction to cell mediated immunity measurement of gamma interferon antigen response without active tuberculosis: Secondary | ICD-10-CM | POA: Diagnosis not present

## 2023-05-26 MED ORDER — IPRATROPIUM-ALBUTEROL 0.5-2.5 (3) MG/3ML IN SOLN
RESPIRATORY_TRACT | Status: AC
  Filled 2023-05-26: qty 3

## 2023-05-26 MED ORDER — PREDNISONE 20 MG PO TABS
40.0000 mg | ORAL_TABLET | Freq: Every day | ORAL | 0 refills | Status: AC
Start: 2023-05-26 — End: 2023-06-01
  Filled 2023-05-27: qty 10, 5d supply, fill #0

## 2023-05-26 MED ORDER — DEXAMETHASONE SODIUM PHOSPHATE 10 MG/ML IJ SOLN
10.0000 mg | Freq: Once | INTRAMUSCULAR | Status: AC
  Administered 2023-05-26: 10 mg via INTRAMUSCULAR

## 2023-05-26 MED ORDER — DEXAMETHASONE SODIUM PHOSPHATE 10 MG/ML IJ SOLN
INTRAMUSCULAR | Status: AC
  Filled 2023-05-26: qty 1

## 2023-05-26 MED ORDER — IPRATROPIUM-ALBUTEROL 0.5-2.5 (3) MG/3ML IN SOLN
3.0000 mL | Freq: Once | RESPIRATORY_TRACT | Status: AC
  Administered 2023-05-26: 3 mL via RESPIRATORY_TRACT

## 2023-05-26 MED ORDER — PROMETHAZINE-DM 6.25-15 MG/5ML PO SYRP
5.0000 mL | ORAL_SOLUTION | Freq: Every evening | ORAL | 0 refills | Status: AC | PRN
  Filled 2023-05-27: qty 118, 23d supply, fill #0

## 2023-05-26 MED ORDER — AZITHROMYCIN 250 MG PO TABS
ORAL_TABLET | ORAL | 0 refills | Status: AC
  Filled 2023-05-27: qty 6, 5d supply, fill #0

## 2023-05-26 NOTE — ED Triage Notes (Signed)
 Patient presents with cough,wheezing and chest congestion. Patient states she has the flu last week and was prescribed Tamiflu.

## 2023-05-26 NOTE — ED Provider Notes (Signed)
 [facility]    CSN: [id number] Arrival date & time: 05/26/23  1516      History   Chief Complaint No chief complaint on file.   HPI Anna Brooks is a 58 y.o. female.   Patient presents to urgent care for evaluation of cough, nasal congestion, and generalized fatigue that started approximately 12 to 13 days ago.  She was seen on May 15, 2023 to urgent care where she tested positive for influenza A and treated for bronchitis with prednisone.  She was prescribed Tamiflu as well.  She took medications as prescribed and states meds helped for a short period of time until symptoms returned again and worsened a few days ago.  Reports intermittent shortness of breath and chest tightness associated with coughing.  She feels as though she can hear wheezing to her chest.  Denies recent nausea, vomiting, fever in the last 3 days, abdominal pain, rash, dizziness, leg swelling, orthopnea, and recent antibiotic use in the last 90 days.  She recently had prednisone burst last week (40 mg for 5 days).  No history of COPD, asthma, or any other chronic respiratory problems.  She has never been a smoker.  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     Past Medical History:  Diagnosis Date   Allergy    GERD (gastroesophageal reflux disease)    TB lung, latent 05/13/2023    Patient Active Problem List   Diagnosis Date Noted   Allergic urticaria 03/09/2018   Allergic rhinitis 12/09/2017   Allergic conjunctivitis 12/09/2017   Food allergy 12/09/2017   Oral allergy syndrome 12/09/2017    Past Surgical History:  Procedure Laterality Date   APPENDECTOMY     CARPAL TUNNEL RELEASE     CLOSED REDUCTION FINGER WITH PERCUTANEOUS PINNING Right 01/26/2020   Procedure: CLOSED REDUCTION FINGER WITH PERCUTANEOUS PINNING VS OPRN REDUCTION AND INTERAL FIXATION;  Surgeon: Bradly Bienenstock, MD;  Location: [facility] OR;  Service:  Orthopedics;  Laterality: Right;  WITH IV SEDATION   DENTAL RESTORATION/EXTRACTION WITH X-RAY     TUBAL LIGATION  2001    OB History     Gravida  6   Para  6   Term  6   Preterm      AB      Living  6      SAB      IAB      Ectopic      Multiple      Live Births  6            Home Medications    Prior to Admission medications   Medication Sig Start Date End Date Taking? Authorizing Provider  promethazine-dextromethorphan (PROMETHAZINE-DM) 6.25-15 MG/5ML syrup Take 5 mLs by mouth at bedtime as needed for cough. 05/26/23  Yes Carlisle Beers, FNP  azithromycin (ZITHROMAX) 250 MG tablet Take 1 tablet (250 mg total) by mouth daily. Take first 2 tablets together, then 1 every day until finished. 05/26/23  Yes Carlisle Beers, FNP  EPINEPHrine (AUVI-Q) 0.3 mg/0.3 mL IJ SOAJ injection Use as directed for severe allergic reaction Patient not taking: Reported on 11/07/2021 01/14/18   Bobbitt, Heywood Iles, MD  Multiple Vitamin (MULTIVITAMIN WITH MINERALS) TABS tablet Take 1 tablet by mouth every evening.    [provider]  omeprazole (PRILOSEC) 20 MG capsule take 1 capsule by mouth every morning 09/04/21  oseltamivir (TAMIFLU) 75 MG capsule Take 1 capsule (75 mg total) by mouth every 12 (twelve) hours. 05/15/23   Raspet, Noberto Retort, PA-C  predniSONE (DELTASONE) 20 MG tablet Take 2 tablets (40 mg total) by mouth daily with breakfast for 5 days. 05/26/23 05/31/23 Yes Carlisle Beers, FNP  triamcinolone cream (KENALOG) 0.5 % apply cream topically twice a day as needed 09/04/21       Family History Family History  Problem Relation Age of Onset   Breast cancer Neg Hx    Colon cancer Neg Hx     Social History Social History   Tobacco Use   Smoking status: Never   Smokeless tobacco: Never  Substance Use Topics   Alcohol use: No   Drug use: No     Allergies   Hydrocodone, Codeine, Ibuprofen, Cheese, Chocolate, Penicillin g, Sulfa antibiotics,  and Tetracyclines & related   Review of Systems Review of Systems Per HPI  Physical Exam Triage Vital Signs ED Triage Vitals [05/26/23 1716]  Encounter Vitals Group     BP (!) 150/95     Systolic BP Percentile      Diastolic BP Percentile      Pulse Rate 88     Resp 16     Temp 98.3 F (36.8 C)     Temp Source Oral     SpO2 96 %     Weight      Height      Head Circumference      Peak Flow      Pain Score      Pain Loc      Pain Education      Exclude from Growth Chart    No data found.  Updated Vital Signs BP (!) 150/95 (BP Location: Left Arm)   Pulse 88   Temp 98.3 F (36.8 C) (Oral)   Resp 16   SpO2 96%   Visual Acuity Right Eye Distance:   Left Eye Distance:   Bilateral Distance:    Right Eye Near:   Left Eye Near:    Bilateral Near:     Physical Exam Vitals and nursing note reviewed.  Constitutional:      Appearance: She is not ill-appearing or toxic-appearing.  HENT:     Head: Normocephalic and atraumatic.     Right Ear: Hearing, tympanic membrane, ear canal and external ear normal.     Left Ear: Hearing, tympanic membrane, ear canal and external ear normal.     Nose: Nose normal.     Mouth/Throat:     Lips: Pink.     Mouth: Mucous membranes are moist. No injury or oral lesions.     Dentition: Normal dentition.     Tongue: No lesions.     Pharynx: Oropharynx is clear. Uvula midline. No pharyngeal swelling, oropharyngeal exudate, posterior oropharyngeal erythema, uvula swelling or postnasal drip.     Tonsils: No tonsillar exudate.  Eyes:     General: Lids are normal. Vision grossly intact. Gaze aligned appropriately.     Extraocular Movements: Extraocular movements intact.     Conjunctiva/sclera: Conjunctivae normal.  Neck:     Trachea: Trachea and phonation normal.  Cardiovascular:     Rate and Rhythm: Normal rate and regular rhythm.     Heart sounds: Normal heart sounds, S1 normal and S2 normal.  Pulmonary:     Effort: Pulmonary effort  is normal. No respiratory distress.     Breath sounds: Normal air entry. Wheezing (Diffuse inspiratory  and expiratory wheezing heard all lung fields bilaterally with most prominent wheezing heard to the right middle lung field.) present. No rhonchi or rales.     Comments: Speaks in full sentences without difficulty and without increased respiratory effort. Chest:     Chest wall: No tenderness.  Musculoskeletal:     Cervical back: Neck supple.     Right lower leg: No edema.     Left lower leg: No edema.  Lymphadenopathy:     Cervical: No cervical adenopathy.  Skin:    General: Skin is warm and dry.     Capillary Refill: Capillary refill takes less than 2 seconds.     Findings: No rash.  Neurological:     General: No focal deficit present.     Mental Status: She is alert and oriented to person, place, and time. Mental status is at baseline.     Cranial Nerves: No dysarthria or facial asymmetry.  Psychiatric:        Mood and Affect: Mood normal.        Speech: Speech normal.        Behavior: Behavior normal.        Thought Content: Thought content normal.        Judgment: Judgment normal.      UC Treatments / Results  Labs (all labs ordered are listed, but only abnormal results are displayed) Labs Reviewed - No data to display  EKG   Radiology No results found.  Procedures Procedures (including critical care time)  Medications Ordered in UC Medications  ipratropium-albuterol (DUONEB) 0.5-2.5 (3) MG/3ML nebulizer solution 3 mL (3 mLs Nebulization Given 05/26/23 1820)  dexamethasone (DECADRON) injection 10 mg (10 mg Intramuscular Given 05/26/23 1820)    Initial Impression / Assessment and Plan / UC Course  I have reviewed the triage vital signs and the nursing notes.  Pertinent labs & imaging results that were available during my care of the patient were reviewed by me and considered in my medical decision making (see chart for details).   1. Acute bronchitis, acute  cough, shortness of breath Evaluation suggests viral bronchitis, though given patient's history and risk for pneumonia, chest x-ray ordered.  Chest x-ray shows peribronchial thickening consistent with acute bronchitis.  No acute abnormalities when compared to previous chest x-ray performed on May 15, 2023 by my interpretation, staff will call if radiology reread shows abnormality indicating need for change in treatment plan.  DuoNeb breathing treatment and dexamethasone 10mg  IM given in clinic with significant improvement in lung sounds and shortness of breath on reassessment prior to discharge.  May use albuterol inhaler from previous visit every 4-6 hours as needed for shortness of breath and wheezing.  Given length of symptoms, I would like to go ahead and cover for atypical bacterial pathogens with azithromycin as prescribed.  Recommend treatment with steroid, bronchodilator, cough suppressants for symptomatic relief, and expectorants (mucinex) as needed- see AVS.   Counseled patient on potential for adverse effects with medications prescribed/recommended today, strict ER and return-to-clinic precautions discussed, patient verbalized understanding.    Final Clinical Impressions(s) / UC Diagnoses   Final diagnoses:  Shortness of breath  Acute bronchitis, unspecified organism  Acute cough     Discharge Instructions      You have bronchitis which is inflammation of the upper airways in your lungs due to the flu. Your chest x-ray looks similar to the one performed 10 days ago, however I'd like to cover for atypical bacteria by treating you  with azithromycin antibiotic.   We will also treat this with steroids. I gave you a steroid shot today, therefore start taking steroid pills tomorrow. Do not take any NSAIDs with steroid pills (no ibuprofen, naproxen while taking steroid, this could cause stomach upset).   Use albuterol every 4-6 hours as needed for cough, shortness of breath,  and wheezing.   Use guaifenesin (plain mucinex) to break up congestion in nose/chest so that you are able to excrete easier. Drink plenty of fluids to stay well hydrated while taking mucinex so that it works well in the body.   If you develop any new or worsening symptoms or if your symptoms do not start to improve, please return here or follow-up with your primary care provider. If your symptoms are severe, please go to the emergency room.      ED Prescriptions     Medication Sig Dispense Auth. Provider   predniSONE (DELTASONE) 20 MG tablet Take 2 tablets (40 mg total) by mouth daily with breakfast for 5 days. 10 tablet Carlisle Beers, FNP   azithromycin (ZITHROMAX) 250 MG tablet Take 1 tablet (250 mg total) by mouth daily. Take first 2 tablets together, then 1 every day until finished. 6 tablet Carlisle Beers, FNP   promethazine-dextromethorphan (PROMETHAZINE-DM) 6.25-15 MG/5ML syrup Take 5 mLs by mouth at bedtime as needed for cough. 118 mL Carlisle Beers, FNP      PDMP not reviewed this encounter.   Carlisle Beers, [place] 05/26/23 1831

## 2023-05-26 NOTE — Discharge Instructions (Addendum)
 You have bronchitis which is inflammation of the upper airways in your lungs due to the flu. Your chest x-ray looks similar to the one performed 10 days ago, however I'd like to cover for atypical bacteria by treating you with azithromycin antibiotic.   We will also treat this with steroids. I gave you a steroid shot today, therefore start taking steroid pills tomorrow. Do not take any NSAIDs with steroid pills (no ibuprofen, naproxen while taking steroid, this could cause stomach upset).   Use albuterol every 4-6 hours as needed for cough, shortness of breath, and wheezing.   Use guaifenesin (plain mucinex) to break up congestion in nose/chest so that you are able to excrete easier. Drink plenty of fluids to stay well hydrated while taking mucinex so that it works well in the body.   If you develop any new or worsening symptoms or if your symptoms do not start to improve, please return here or follow-up with your primary care provider. If your symptoms are severe, please go to the emergency room.

## 2023-05-27 ENCOUNTER — Other Ambulatory Visit: Payer: Self-pay

## 2023-07-18 DIAGNOSIS — R7612 Nonspecific reaction to cell mediated immunity measurement of gamma interferon antigen response without active tuberculosis: Secondary | ICD-10-CM | POA: Diagnosis not present

## 2023-08-20 ENCOUNTER — Ambulatory Visit
Admission: RE | Admit: 2023-08-20 | Discharge: 2023-08-20 | Disposition: A | Discharge status: Home / Self Care | Source: Ambulatory Visit | Attending: Internal Medicine | Admitting: Internal Medicine

## 2023-08-20 ENCOUNTER — Other Ambulatory Visit: Payer: Self-pay | Admitting: Internal Medicine

## 2023-08-20 DIAGNOSIS — Z1231 Encounter for screening mammogram for malignant neoplasm of breast: Secondary | ICD-10-CM | POA: Diagnosis not present

## 2023-08-27 ENCOUNTER — Other Ambulatory Visit: Payer: Self-pay | Admitting: Internal Medicine

## 2023-08-27 DIAGNOSIS — R928 Other abnormal and inconclusive findings on diagnostic imaging of breast: Secondary | ICD-10-CM

## 2023-09-12 ENCOUNTER — Ambulatory Visit
Admission: RE | Admit: 2023-09-12 | Discharge: 2023-09-12 | Disposition: A | Discharge status: Home / Self Care | Source: Ambulatory Visit | Attending: Internal Medicine | Admitting: Internal Medicine

## 2023-09-12 ENCOUNTER — Ambulatory Visit

## 2023-09-12 DIAGNOSIS — R928 Other abnormal and inconclusive findings on diagnostic imaging of breast: Secondary | ICD-10-CM | POA: Diagnosis not present

## 2024-01-23 ENCOUNTER — Other Ambulatory Visit: Payer: Self-pay

## 2024-01-23 ENCOUNTER — Encounter (HOSPITAL_BASED_OUTPATIENT_CLINIC_OR_DEPARTMENT_OTHER): Payer: Self-pay

## 2024-01-23 ENCOUNTER — Emergency Department (HOSPITAL_BASED_OUTPATIENT_CLINIC_OR_DEPARTMENT_OTHER)
Admission: EM | Admit: 2024-01-23 | Discharge: 2024-01-23 | Disposition: A | Discharge status: Home / Self Care | Attending: Emergency Medicine | Admitting: Emergency Medicine

## 2024-01-23 ENCOUNTER — Emergency Department (HOSPITAL_BASED_OUTPATIENT_CLINIC_OR_DEPARTMENT_OTHER)

## 2024-01-23 DIAGNOSIS — Z91010 Allergy to peanuts: Secondary | ICD-10-CM | POA: Diagnosis not present

## 2024-01-23 DIAGNOSIS — R000 Tachycardia, unspecified: Secondary | ICD-10-CM | POA: Insufficient documentation

## 2024-01-23 DIAGNOSIS — R112 Nausea with vomiting, unspecified: Secondary | ICD-10-CM | POA: Diagnosis not present

## 2024-01-23 DIAGNOSIS — R1110 Vomiting, unspecified: Secondary | ICD-10-CM | POA: Diagnosis not present

## 2024-01-23 DIAGNOSIS — R509 Fever, unspecified: Secondary | ICD-10-CM | POA: Insufficient documentation

## 2024-01-23 DIAGNOSIS — R630 Anorexia: Secondary | ICD-10-CM | POA: Insufficient documentation

## 2024-01-23 DIAGNOSIS — R7401 Elevation of levels of liver transaminase levels: Secondary | ICD-10-CM | POA: Insufficient documentation

## 2024-01-23 DIAGNOSIS — R1084 Generalized abdominal pain: Secondary | ICD-10-CM | POA: Insufficient documentation

## 2024-01-23 LAB — COMPREHENSIVE METABOLIC PANEL WITH GFR
ALT: 25 U/L (ref 0–44)
AST: 92 U/L — ABNORMAL HIGH (ref 15–41)
Albumin: 3.6 g/dL (ref 3.5–5.0)
Alkaline Phosphatase: 122 U/L (ref 38–126)
Anion gap: 12 (ref 5–15)
BUN: 19 mg/dL (ref 6–20)
CO2: 24 mmol/L (ref 22–32)
Calcium: 9.2 mg/dL (ref 8.9–10.3)
Chloride: 103 mmol/L (ref 98–111)
Creatinine, Ser: 1.08 mg/dL — ABNORMAL HIGH (ref 0.44–1.00)
GFR, Estimated: 59 mL/min — ABNORMAL LOW (ref >60)
Glucose, Bld: 188 mg/dL — ABNORMAL HIGH (ref 70–99)
Potassium: 3.6 mmol/L (ref 3.5–5.1)
Sodium: 139 mmol/L (ref 135–145)
Total Bilirubin: 1.1 mg/dL (ref 0.0–1.2)
Total Protein: 7 g/dL (ref 6.5–8.1)

## 2024-01-23 LAB — URINALYSIS, ROUTINE W REFLEX MICROSCOPIC
Bilirubin Urine: NEGATIVE
Glucose, UA: NEGATIVE mg/dL
Ketones, ur: NEGATIVE mg/dL
Leukocytes,Ua: NEGATIVE
Nitrite: NEGATIVE
Protein, ur: NEGATIVE mg/dL
Specific Gravity, Urine: <=1.005 (ref 1.005–1.030)
pH: 6 (ref 5.0–8.0)

## 2024-01-23 LAB — RESP PANEL BY RT-PCR (RSV, FLU A&B, COVID)  RVPGX2
Influenza A by PCR: NEGATIVE
Influenza B by PCR: NEGATIVE
Resp Syncytial Virus by PCR: NEGATIVE
SARS Coronavirus 2 by RT PCR: NEGATIVE

## 2024-01-23 LAB — URINALYSIS, MICROSCOPIC (REFLEX): WBC, UA: NONE SEEN WBC/hpf (ref 0–5)

## 2024-01-23 LAB — CBC
HCT: 35.8 % — ABNORMAL LOW (ref 36.0–46.0)
Hemoglobin: 11.9 g/dL — ABNORMAL LOW (ref 12.0–15.0)
MCH: 27.7 pg (ref 26.0–34.0)
MCHC: 33.2 g/dL (ref 30.0–36.0)
MCV: 83.4 fL (ref 80.0–100.0)
Platelets: 119 K/uL — ABNORMAL LOW (ref 150–400)
RBC: 4.29 MIL/uL (ref 3.87–5.11)
RDW: 14.3 % (ref 11.5–15.5)
WBC: 10.8 K/uL — ABNORMAL HIGH (ref 4.0–10.5)
nRBC: 0 % (ref 0.0–0.2)

## 2024-01-23 LAB — LIPASE, BLOOD: Lipase: 28 U/L (ref 11–51)

## 2024-01-23 LAB — LACTIC ACID, PLASMA
Lactic Acid, Venous: 2.3 mmol/L (ref 0.5–1.9)
Lactic Acid, Venous: 1.4 mmol/L (ref 0.5–1.9)

## 2024-01-23 MED ORDER — SODIUM CHLORIDE 0.9 % IV BOLUS
1000.0000 mL | Freq: Once | INTRAVENOUS | Status: AC
  Administered 2024-01-23: 1000 mL via INTRAVENOUS

## 2024-01-23 MED ORDER — ONDANSETRON HCL 4 MG/2ML IJ SOLN
4.0000 mg | Freq: Once | INTRAMUSCULAR | Status: AC
  Administered 2024-01-23: 4 mg via INTRAVENOUS
  Filled 2024-01-23: qty 2

## 2024-01-23 MED ORDER — ONDANSETRON HCL 4 MG PO TABS: 4.0000 mg | ORAL_TABLET | Freq: Three times a day (TID) | ORAL | 0 refills | Status: AC | PRN

## 2024-01-23 MED ORDER — IOHEXOL 300 MG/ML  SOLN
75.0000 mL | Freq: Once | INTRAMUSCULAR | Status: AC | PRN
  Administered 2024-01-23: 100 mL via INTRAVENOUS

## 2024-01-23 MED ORDER — ACETAMINOPHEN 500 MG PO TABS
1000.0000 mg | ORAL_TABLET | Freq: Once | ORAL | Status: AC
  Administered 2024-01-23: 1000 mg via ORAL
  Filled 2024-01-23: qty 2

## 2024-01-23 NOTE — ED Provider Notes (Signed)
 [place] EM[facility]GENCY DEPARTMENT AT [facility] [place] Provider Note   CSN: [id number] Arrival date & time: 01/23/24  1457     Patient presents with: No chief complaint on file.   Anna Brooks is a 58 y.o. female.   Patient is here for evaluation of new onset vomiting, chills, and fever. She woke up last night with chills and fever of 100F. She soon had multiple episodes of vomiting. She was able to keep down Excedrin tablet and go back to sleep. This morning she continued to feel unwell and has been unable to tolerate PO intake, including porridge or chicken soup. She was not even able to try to eat these foods, just the smell made her vomit. She has been able to keep down water, green tea, and lemon water. She reports generalized abdominal pain with vomiting. She reports nausea and vomiting with smelling food, otherwise she is not nauseated at this time. No known sick contacts. No ingestion of suspicious foods. No change in bowels or urine. She had a normal bowel movement yesterday and this morning. Denies dizziness, syncope, shortness of breath, chest pain, blood in stool, blood in emesis. No recent cough or congestion. She works in the healthcare setting.  The history is provided by the patient.  Emesis Severity:  Moderate Duration:  1 day Timing:  Intermittent Progression:  Unchanged Chronicity:  New Recent urination:  Normal Context: not self-induced   Relieved by:  None tried Worsened by:  Food smell Associated symptoms: abdominal pain, chills and fever   Associated symptoms: no cough, no diarrhea, no headaches and no URI   Risk factors: prior abdominal surgery (appendectomy)   Risk factors: no sick contacts and no suspect food intake        Prior to Admission medications   Medication Sig Start Date End Date Taking? Authorizing Provider  EPINEPHrine  (AUVI-Q ) 0.3 mg/0.3 mL IJ SOAJ injection Use as directed for severe allergic reaction Patient not taking: Reported on  11/07/2021 01/14/18   Bobbitt, Elgin Pepper, MD  Multiple Vitamin (MULTIVITAMIN WITH MINERALS) TABS tablet Take 1 tablet by mouth every evening.    [provider]  omeprazole  (PRILOSEC) 20 MG capsule take 1 capsule by mouth every morning 09/04/21     oseltamivir  (TAMIFLU ) 75 MG capsule Take 1 capsule (75 mg total) by mouth every 12 (twelve) hours. 05/15/23   Raspet, Erin K, PA-C  promethazine -dextromethorphan (PROMETHAZINE -DM) 6.25-15 MG/5ML syrup Take 5 mLs by mouth at bedtime as needed for cough. 05/26/23   Enedelia Dorna HERO, FNP  triamcinolone  cream (KENALOG ) 0.5 % apply cream topically twice a day as needed 09/04/21       Allergies: Hydrocodone , Codeine, Ibuprofen , Peanut -containing drug products, Shellfish allergy, Cheese, Chocolate, Penicillin g, Sulfa  antibiotics, and Tetracyclines & related    Review of Systems  Constitutional:  Positive for chills and fever.  Respiratory:  Negative for cough.   Gastrointestinal:  Positive for abdominal pain and vomiting. Negative for diarrhea.  Neurological:  Negative for headaches.    Updated Vital Signs BP 135/79   Pulse (!) 114   Temp 99.8 F (37.7 C) (Oral)   Resp 20   Ht 5' 6 (1.676 m)   Wt 74.8 kg   SpO2 99%   BMI 26.62 kg/m   Physical Exam Vitals and nursing note reviewed.  Constitutional:      General: She is not in acute distress.    Appearance: Normal appearance. She is not ill-appearing.  HENT:     Head: Normocephalic  and atraumatic.     Nose: Nose normal. No congestion or rhinorrhea.     Mouth/Throat:     Mouth: Mucous membranes are moist.     Pharynx: Oropharynx is clear. No oropharyngeal exudate or posterior oropharyngeal erythema.  Eyes:     General: No scleral icterus.    Extraocular Movements: Extraocular movements intact.  Cardiovascular:     Rate and Rhythm: Regular rhythm. Tachycardia present.     Pulses: Normal pulses.     Heart sounds: Normal heart sounds.  Pulmonary:     Effort: Pulmonary  effort is normal. No respiratory distress.     Breath sounds: Normal breath sounds. No wheezing, rhonchi or rales.  Abdominal:     General: Abdomen is flat. Bowel sounds are decreased. There is no distension.     Palpations: Abdomen is soft. There is no mass or pulsatile mass.     Tenderness: There is no abdominal tenderness. There is no guarding. Negative signs include Murphy's sign, Rovsing's sign and McBurney's sign.  Skin:    General: Skin is warm and dry.     Coloration: Skin is not jaundiced or pale.     Findings: No rash.  Neurological:     Mental Status: She is alert and oriented to person, place, and time.     (all labs ordered are listed, but only abnormal results are displayed) Labs Reviewed  RESP PANEL BY RT-PCR (RSV, FLU A&B, COVID)  [id number]  LIPASE, BLOOD  COMPREHENSIVE METABOLIC PANEL WITH GFR  CBC  URINALYSIS, ROUTINE W REFLEX MICROSCOPIC    EKG: None  Radiology: No results found.   Procedures   Medications Ordered in the ED - No data to display   Patient presents to the ED for concern of persistent vomiting, chills, fever, this involves an extensive number of treatment options, and is a complaint that carries with it a high risk of complications and morbidity.  The differential diagnosis includes norovirus, gastroenteritis, pancreatitis, sepsis, small/large bowel obstruction, DKA, UTI, peptic ulcer disease, GERD, ETOH, withdrawal, pregnancy, abnormal intake, or biliary disease.  Co morbidities that complicate the patient evaluation  Previous abdominal surgery: Appendectomy, Tubal ligation   Lab Tests:  I Ordered, and personally interpreted labs.  The pertinent results include: Elevated lactic acid of 2.3, resolved with fluids. UA with few hgb and rare bacteria; sent culture.   Imaging Studies ordered:  I ordered imaging studies including chest x-ray and CT abdomen/pelvis.  Imaging shows acute abnormalities on chest x-ray. CAP shows no acute  abnormalities; does show thickened endometrium with recommendation for non-emergent ultrasound for further evaluation.    Cardiac Monitoring:  EKG shows sinus tachycardia with no obvious signs of STEMI.   Medicines ordered and prescription drug management:  I ordered medication including zofran  and tylenol   for nausea and fever management.  Reevaluation of the patient after these medicines showed that the patient improved I have reviewed the patients home medicines and have made adjustments as needed   Problem List / ED Course:  Fever, N/V, chills.  Considering patient's age accompanied by low grade fever and tachycardia, lactic acid, Chest x-ray, and blood cultures ordered. No known source of infection. Zofran  and Tylenol  given.  Elevated lactic acid and AST, otherwise normal LFTs. Normal lipase, very slight elevation of WBC. Fluids given and CT Abdomen/pelvis ordered to further investigate potential source of infection.  Lactic acid improved with fluids. Imaging shows no acute findings. UA with small hgb and rare bacteria -- sent for urine culture.  Blood cultures pending. Tolerated PO intake. No episodes of emesis in ED today. Patient feeling comfortable overall.   Reevaluation:  After the interventions noted above, I reevaluated the patient and found that they have :improved   Dispostion:  After consideration of the diagnostic results and the patients response to treatment, I feel that the patent would benefit from continued supportive care in the home setting. Given zofran  to help with nausea/vomiting. Patient will continue to monitor symptoms and return if symptoms persist or worsen.   Blood and urine cultures pending. We will call with these results and advise if treatment or further evaluation is necessary based on these results.  Patient will follow-up with Gyn provider in non-emergent setting for further evaluation of endometrial thickening seen on CT.                                    Medical Decision Making Amount and/or Complexity of Data Reviewed Labs: ordered. Radiology: ordered.  Risk OTC drugs. Prescription drug management.        Final diagnoses:  None    ED Discharge Orders     None          Rosina Almarie DELENA DEVONNA 01/23/24 2314    Ruthe Cornet, DO 01/26/24 1106

## 2024-01-23 NOTE — ED Triage Notes (Signed)
 Pt reports fever/chills/emesis since Thursday.

## 2024-01-23 NOTE — ED Notes (Signed)
 Patient tolerate Po fluids with no Nausea or vomiting at this time.

## 2024-01-23 NOTE — Discharge Instructions (Addendum)
 It was a pleasure meeting with you today.  As discussed there were no acute findings on your imaging today. It was seen that your endometrial lining was thickened and it is recommended to follow-up with your PCP or GYN provider for further evaluation with a non-emergent ultrasound.   Blood and urine cultures pending result. We will call with these results and advise if treatment or further evaluation is necessary based on these results.  Follow-up with PCP for reevaluation in 72 hours for ongoing surveillance of symptoms.  Please return if your symptoms worsen or you experience chest pain, shortness of breath, loss of consciousness, persistent vomiting, fever that does not respond to OTC medications, or any other concerning symptoms.

## 2024-01-24 LAB — URINE CULTURE: Culture: NO GROWTH

## 2024-01-28 LAB — CULTURE, BLOOD (ROUTINE X 2)
Culture: NO GROWTH
Culture: NO GROWTH
Special Requests: ADEQUATE
Special Requests: ADEQUATE

## 2024-02-24 ENCOUNTER — Other Ambulatory Visit: Payer: Self-pay

## 2024-02-24 DIAGNOSIS — K30 Functional dyspepsia: Secondary | ICD-10-CM | POA: Diagnosis not present

## 2024-04-14 ENCOUNTER — Ambulatory Visit: Payer: Self-pay | Admitting: Obstetrics & Gynecology

## 2024-04-15 ENCOUNTER — Ambulatory Visit: Admitting: Obstetrics

## 2024-04-15 ENCOUNTER — Encounter: Payer: Self-pay | Admitting: Obstetrics

## 2024-04-15 VITALS — BP 156/91 | HR 85 | Ht 67.0 in | Wt 162.5 lb

## 2024-04-15 DIAGNOSIS — Z780 Asymptomatic menopausal state: Secondary | ICD-10-CM

## 2024-04-15 DIAGNOSIS — R9389 Abnormal findings on diagnostic imaging of other specified body structures: Secondary | ICD-10-CM

## 2024-04-15 DIAGNOSIS — Z01419 Encounter for gynecological examination (general) (routine) without abnormal findings: Secondary | ICD-10-CM | POA: Diagnosis not present

## 2024-04-15 DIAGNOSIS — R030 Elevated blood-pressure reading, without diagnosis of hypertension: Secondary | ICD-10-CM | POA: Diagnosis not present

## 2024-04-15 MED ORDER — MEGESTROL ACETATE 40 MG PO TABS: 80.0000 mg | ORAL_TABLET | Freq: Two times a day (BID) | ORAL | 0 refills | Status: AC

## 2024-04-15 NOTE — Progress Notes (Signed)
 Pt presents for annual. Pt has no question or concerns at this time.

## 2024-04-15 NOTE — Progress Notes (Addendum)
 "  Subjective:        Anna Brooks is a 59 y.o. female here for a routine exam.  Current complaints: NONE.    Personal health questionnaire:  Is patient Ashkenazi Jewish, have a family history of breast and/or ovarian cancer: no Is there a family history of uterine cancer diagnosed at age < 32, gastrointestinal cancer, urinary tract cancer, family member who is a Personnel Officer syndrome-associated carrier: no Is the patient overweight and hypertensive, family history of diabetes, personal history of gestational diabetes, preeclampsia or PCOS: no Is patient over 74, have PCOS,  family history of premature CHD under age 85, diabetes, smoke, have hypertension or peripheral artery disease:  no At any time, has a partner hit, kicked or otherwise hurt or frightened you?: no Over the past 2 weeks, have you felt down, depressed or hopeless?: no Over the past 2 weeks, have you felt little interest or pleasure in doing things?:no   Gynecologic History No LMP recorded. Patient is postmenopausal. Contraception: post menopausal status Last Pap: 2023. Results were: ASCUS with negative High Risk HPV Last mammogram: 2025. Results were: normal  Obstetric History OB History  Gravida Para Term Preterm AB Living  6 6 6   6   SAB IAB Ectopic Multiple Live Births      6    # Outcome Date GA Lbr Len/2nd Weight Sex Type Anes PTL Lv  6 Term           5 Term           4 Term           3 Term           2 Term           1 Term             Past Medical History:  Diagnosis Date   Allergy    GERD (gastroesophageal reflux disease)    TB lung, latent 05/13/2023    Past Surgical History:  Procedure Laterality Date   APPENDECTOMY     CARPAL TUNNEL RELEASE     CLOSED REDUCTION FINGER WITH PERCUTANEOUS PINNING Right 01/26/2020   Procedure: CLOSED REDUCTION FINGER WITH PERCUTANEOUS PINNING VS [facility] REDUCTION AND INTERAL FIXATION;  Surgeon: Shari Easter, MD;  Location: [facility] OR;  Service: Orthopedics;  Laterality:  Right;  WITH IV SEDATION   DENTAL RESTORATION/EXTRACTION WITH X-RAY     TUBAL LIGATION  2001    Current Medications[1] Allergies[2]  Social History   Tobacco Use   Smoking status: Never   Smokeless tobacco: Never  Substance Use Topics   Alcohol use: No    Family History  Problem Relation Age of Onset   Breast cancer Neg Hx    Colon cancer Neg Hx       Review of Systems  Constitutional: negative for fatigue and weight loss Respiratory: negative for cough and wheezing Cardiovascular: negative for chest pain, fatigue and palpitations Gastrointestinal: negative for abdominal pain and change in bowel habits Musculoskeletal:negative for myalgias Neurological: negative for gait problems and tremors Behavioral/Psych: negative for abusive relationship, depression Endocrine: negative for temperature intolerance    Genitourinary:negative for abnormal menstrual periods, genital lesions, hot flashes, sexual problems and vaginal discharge Integument/breast: negative for breast lump, breast tenderness, nipple discharge and skin lesion(s)    Objective:       BP (!) 156/91   Pulse 85   Ht 5' 7 (1.702 m)   Wt 162 lb 8 oz (73.7 kg)  BMI 25.45 kg/m  General:   Alert and no distress  Skin:   no rash or abnormalities  Lungs:   clear to auscultation bilaterally  Heart:   regular rate and rhythm, S1, S2 normal, no murmur, click, rub or gallop  Breasts:   normal without suspicious masses, skin or nipple changes or axillary nodes  Abdomen:  normal findings: no organomegaly, soft, non-tender and no hernia  Pelvis:  External genitalia: normal general appearance Urinary system: urethral meatus normal and bladder without fullness, nontender Vaginal: normal without tenderness, induration or masses Cervix: normal appearance Adnexa: normal bimanual exam Uterus: anteverted and non-tender, normal size   Lab Review Urine pregnancy test Labs reviewed yes Radiologic studies reviewed yes  I  have spent a total of 20 minutes of face-to-face and non-face-to-face time, excluding clinical staff time, reviewing notes and preparing to see patient, ordering tests and/or medications, and counseling the patient.    Assessment:    1. Encounter for gynecological examination with Papanicolaou smear of cervix (Primary) Rx: - Cytology - PAP( [place])  2. Postmenopausal - doing well  3. Thickened endometrium on CT Scan of Abdomen and Pelvis, for flu-like symptoms - negative history of postmenopausal bleeding or pelvic pain Rx: - megestrol  (MEGACE ) 40 MG tablet; Take 2 tablets (80 mg total) by mouth 2 (two) times daily.  Dispense: 120 tablet; Refill: 0 - US  PELVIC COMPLETE WITH TRANSVAGINAL; Future   4. Elevated blood pressure without diagnosis of hypertension - follow up with PCP    Plan:    Education reviewed: calcium supplements, depression evaluation, low fat, low cholesterol diet, safe sex/STD prevention, self breast exams, and weight bearing exercise. Follow up in: 9 weeks.  Ultrasound results.  Meds ordered this encounter  Medications   megestrol  (MEGACE ) 40 MG tablet    Sig: Take 2 tablets (80 mg total) by mouth 2 (two) times daily.    Dispense:  120 tablet    Refill:  0   Orders Placed This Encounter  Procedures   US  PELVIC COMPLETE WITH TRANSVAGINAL    Standing Status:   Future    Expected Date:   06/09/2024    Expiration Date:   04/15/2025    Reason for Exam (SYMPTOM  OR DIAGNOSIS REQUIRED):   ENDOMETRIAL tHICKENING ON CT SCAN    Preferred imaging location?:   [facility]    CARLIN RONAL CENTERS, MD, FACOG Attending Obstetrician & Gynecologist, [facility] for [facility], [facility], [facility] 04/15/2024     [1]  Current Outpatient Medications:    megestrol  (MEGACE ) 40 MG tablet, Take 2 tablets (80 mg total) by mouth 2 (two) times daily., Disp: 120 tablet, Rfl: 0   Multiple Vitamin (MULTIVITAMIN WITH MINERALS) TABS  tablet, Take 1 tablet by mouth every evening., Disp: , Rfl:    omeprazole  (PRILOSEC) 20 MG capsule, take 1 capsule by mouth every morning, Disp: 30 capsule, Rfl: 2   promethazine -dextromethorphan (PROMETHAZINE -DM) 6.25-15 MG/5ML syrup, Take 5 mLs by mouth at bedtime as needed for cough., Disp: 118 mL, Rfl: 0   triamcinolone  cream (KENALOG ) 0.5 %, apply cream topically twice a day as needed, Disp: 120 g, Rfl: 2   EPINEPHrine  (AUVI-Q ) 0.3 mg/0.3 mL IJ SOAJ injection, Use as directed for severe allergic reaction (Patient not taking: Reported on 04/15/2024), Disp: 2 Device, Rfl: 2   oseltamivir  (TAMIFLU ) 75 MG capsule, Take 1 capsule (75 mg total) by mouth every 12 (twelve) hours. (Patient not taking: Reported on 04/15/2024), Disp:  10 capsule, Rfl: 0 [2]  Allergies Allergen Reactions   Hydrocodone  Itching and Rash   Codeine Itching   Ibuprofen  Other (See Comments)   Peanut -Containing Drug Products    Shellfish Allergy    Cheese Itching and Rash   Chocolate Itching and Rash   Penicillin G Rash   Sulfa  Antibiotics Rash   Tetracyclines & Related Rash   "

## 2024-04-20 LAB — CYTOLOGY - PAP
Comment: NEGATIVE
Diagnosis: NEGATIVE
High risk HPV: NEGATIVE

## 2024-04-21 ENCOUNTER — Ambulatory Visit (HOSPITAL_COMMUNITY)
Admission: RE | Admit: 2024-04-21 | Discharge: 2024-04-21 | Disposition: A | Source: Ambulatory Visit | Attending: Obstetrics | Admitting: Obstetrics

## 2024-04-21 DIAGNOSIS — R9389 Abnormal findings on diagnostic imaging of other specified body structures: Secondary | ICD-10-CM

## 2024-04-22 ENCOUNTER — Ambulatory Visit: Payer: Self-pay | Admitting: Obstetrics

## 2024-06-17 ENCOUNTER — Ambulatory Visit: Payer: Self-pay | Admitting: Obstetrics
